# Patient Record
Sex: Female | Born: 1965 | Race: White | Hispanic: No | Marital: Married | Smoking: Never smoker
Health system: Southern US, Community
[De-identification: ages and names within clinical notes are randomized; demographics above are authoritative.]

## PROBLEM LIST (undated history)

## (undated) DIAGNOSIS — B009 Herpesviral infection, unspecified: Secondary | ICD-10-CM

## (undated) DIAGNOSIS — T8859XA Other complications of anesthesia, initial encounter: Secondary | ICD-10-CM

## (undated) DIAGNOSIS — T4145XA Adverse effect of unspecified anesthetic, initial encounter: Secondary | ICD-10-CM

## (undated) DIAGNOSIS — I509 Heart failure, unspecified: Secondary | ICD-10-CM

## (undated) DIAGNOSIS — N9489 Other specified conditions associated with female genital organs and menstrual cycle: Secondary | ICD-10-CM

## (undated) DIAGNOSIS — I219 Acute myocardial infarction, unspecified: Secondary | ICD-10-CM

## (undated) DIAGNOSIS — I2510 Atherosclerotic heart disease of native coronary artery without angina pectoris: Secondary | ICD-10-CM

## (undated) HISTORY — DX: Other specified conditions associated with female genital organs and menstrual cycle: N94.89

## (undated) HISTORY — DX: Herpesviral infection, unspecified: B00.9

---

## 2006-03-29 HISTORY — PX: INGUINAL HERNIA REPAIR: SHX194

## 2007-04-17 ENCOUNTER — Other Ambulatory Visit: Admission: RE | Admit: 2007-04-17 | Discharge: 2007-04-17 | Payer: Self-pay | Admitting: Obstetrics & Gynecology

## 2008-04-25 ENCOUNTER — Other Ambulatory Visit: Admission: RE | Admit: 2008-04-25 | Discharge: 2008-04-25 | Payer: Self-pay | Admitting: Obstetrics and Gynecology

## 2010-07-23 ENCOUNTER — Ambulatory Visit (HOSPITAL_COMMUNITY)
Admission: RE | Admit: 2010-07-23 | Discharge: 2010-07-23 | Disposition: A | Discharge status: Home / Self Care | Payer: BC Managed Care – PPO | Source: Ambulatory Visit | Attending: Obstetrics & Gynecology | Admitting: Obstetrics & Gynecology

## 2010-07-23 ENCOUNTER — Other Ambulatory Visit: Payer: Self-pay | Admitting: Obstetrics & Gynecology

## 2010-07-23 DIAGNOSIS — R1031 Right lower quadrant pain: Secondary | ICD-10-CM | POA: Insufficient documentation

## 2012-06-12 ENCOUNTER — Encounter: Payer: Self-pay | Admitting: Certified Nurse Midwife

## 2012-06-14 ENCOUNTER — Encounter: Payer: Self-pay | Admitting: Certified Nurse Midwife

## 2012-06-14 ENCOUNTER — Ambulatory Visit (INDEPENDENT_AMBULATORY_CARE_PROVIDER_SITE_OTHER): Payer: BC Managed Care – PPO | Admitting: Certified Nurse Midwife

## 2012-06-14 VITALS — BP 110/70

## 2012-06-14 DIAGNOSIS — R5383 Other fatigue: Secondary | ICD-10-CM

## 2012-06-14 DIAGNOSIS — F458 Other somatoform disorders: Secondary | ICD-10-CM

## 2012-06-14 DIAGNOSIS — F419 Anxiety disorder, unspecified: Secondary | ICD-10-CM

## 2012-06-14 NOTE — Progress Notes (Addendum)
47 y.o. mwf female g3p2012 here for discussion of concerns with panic attack and questionable anxiety noted in August, 2013. Discussed concerns about "worry about anything" seems better since aex 05/17/2012. Started on Vitamin D3 50,000 one month ago and also started Magnesium 325 mg po at the same time. "My goal is to decrease stress in my life, so reading and doing self reflection."  Now doing day to day scheduling, which is working.  Denies insomnia, crying, panic attacks or change appetite. Family has noticed "she is less fatigued and stressed"  Sleeping well now especially with Magnesium use.  Exercising daily, and work entails walking distances. Patient feels medication is not necessary at this time.Denies self harm or others  O: Healthy WD/WN appropriate dress, affect normal, orientation x3  AEX WNL  A: Social Anxiety responding to self treatment with Vitamin D3 and Magnesium and Self meditation  P:Continue OTC medication daily, self meditation, and seek support from family Has rescue remedy(OTC) for use if panic attack occurs.  Patient counseled that if her self treatment does not continue to help return visit asap. Lab draw Vitamin D Return visit prn    Face to face regarding social stress and management  Time 38 minutes Reviewed, TL

## 2012-06-14 NOTE — Patient Instructions (Signed)

## 2012-06-19 ENCOUNTER — Other Ambulatory Visit: Payer: Self-pay

## 2012-06-19 DIAGNOSIS — E559 Vitamin D deficiency, unspecified: Secondary | ICD-10-CM

## 2012-07-20 ENCOUNTER — Other Ambulatory Visit (INDEPENDENT_AMBULATORY_CARE_PROVIDER_SITE_OTHER): Payer: BC Managed Care – PPO

## 2012-07-20 DIAGNOSIS — E559 Vitamin D deficiency, unspecified: Secondary | ICD-10-CM

## 2012-07-26 ENCOUNTER — Telehealth: Payer: Self-pay | Admitting: *Deleted

## 2012-07-26 NOTE — Telephone Encounter (Signed)
Pt returning Joy's call regarding Vitamin D level.  Results given and advised to take [phone] IU daily per DL result note.  Pt agreeable with plan and agreeable with recheck at AEX in February 2015.

## 2012-08-30 ENCOUNTER — Ambulatory Visit: Payer: Self-pay | Admitting: Certified Nurse Midwife

## 2012-12-31 ENCOUNTER — Inpatient Hospital Stay (HOSPITAL_COMMUNITY)
Admission: EM | Admit: 2012-12-31 | Discharge: 2013-01-04 | DRG: 550 | Disposition: A | Discharge status: Home / Self Care | Payer: BC Managed Care – PPO | Source: Other Acute Inpatient Hospital | Attending: Interventional Cardiology | Admitting: Interventional Cardiology

## 2012-12-31 ENCOUNTER — Encounter (HOSPITAL_COMMUNITY)
Admission: EM | Disposition: A | Discharge status: Home / Self Care | Payer: BC Managed Care – PPO | Source: Other Acute Inpatient Hospital | Attending: Interventional Cardiology

## 2012-12-31 ENCOUNTER — Other Ambulatory Visit: Payer: Self-pay

## 2012-12-31 DIAGNOSIS — Z79899 Other long term (current) drug therapy: Secondary | ICD-10-CM

## 2012-12-31 DIAGNOSIS — I2510 Atherosclerotic heart disease of native coronary artery without angina pectoris: Secondary | ICD-10-CM

## 2012-12-31 DIAGNOSIS — I5032 Chronic diastolic (congestive) heart failure: Secondary | ICD-10-CM

## 2012-12-31 DIAGNOSIS — I2582 Chronic total occlusion of coronary artery: Secondary | ICD-10-CM | POA: Diagnosis present

## 2012-12-31 DIAGNOSIS — I219 Acute myocardial infarction, unspecified: Principal | ICD-10-CM | POA: Diagnosis present

## 2012-12-31 DIAGNOSIS — I252 Old myocardial infarction: Secondary | ICD-10-CM | POA: Diagnosis present

## 2012-12-31 DIAGNOSIS — I2542 Coronary artery dissection: Secondary | ICD-10-CM | POA: Diagnosis present

## 2012-12-31 DIAGNOSIS — Z8249 Family history of ischemic heart disease and other diseases of the circulatory system: Secondary | ICD-10-CM

## 2012-12-31 DIAGNOSIS — Z7982 Long term (current) use of aspirin: Secondary | ICD-10-CM

## 2012-12-31 DIAGNOSIS — R079 Chest pain, unspecified: Secondary | ICD-10-CM

## 2012-12-31 DIAGNOSIS — I5041 Acute combined systolic (congestive) and diastolic (congestive) heart failure: Secondary | ICD-10-CM | POA: Diagnosis present

## 2012-12-31 DIAGNOSIS — Z955 Presence of coronary angioplasty implant and graft: Secondary | ICD-10-CM

## 2012-12-31 DIAGNOSIS — I2109 ST elevation (STEMI) myocardial infarction involving other coronary artery of anterior wall: Secondary | ICD-10-CM

## 2012-12-31 DIAGNOSIS — E785 Hyperlipidemia, unspecified: Secondary | ICD-10-CM | POA: Diagnosis present

## 2012-12-31 DIAGNOSIS — I5021 Acute systolic (congestive) heart failure: Secondary | ICD-10-CM

## 2012-12-31 DIAGNOSIS — I5031 Acute diastolic (congestive) heart failure: Secondary | ICD-10-CM

## 2012-12-31 HISTORY — DX: Heart failure, unspecified: I50.9

## 2012-12-31 HISTORY — DX: Acute myocardial infarction, unspecified: I21.9

## 2012-12-31 HISTORY — PX: CORONARY STENT INTERVENTION: CATH118234

## 2012-12-31 HISTORY — PX: LEFT HEART CATHETERIZATION WITH CORONARY ANGIOGRAM: SHX5451

## 2012-12-31 HISTORY — DX: Atherosclerotic heart disease of native coronary artery without angina pectoris: I25.10

## 2012-12-31 LAB — MRSA PCR SCREENING: MRSA by PCR: NEGATIVE

## 2012-12-31 SURGERY — LEFT HEART CATHETERIZATION WITH CORONARY ANGIOGRAM
Anesthesia: LOCAL

## 2012-12-31 MED ORDER — ALPRAZOLAM 0.25 MG PO TABS: 0.2500 mg | ORAL_TABLET | Freq: Two times a day (BID) | ORAL | Status: DC | PRN

## 2012-12-31 MED ORDER — ACETAMINOPHEN 325 MG PO TABS: 650.0000 mg | ORAL_TABLET | ORAL | Status: DC | PRN

## 2012-12-31 MED ORDER — FENTANYL CITRATE 0.05 MG/ML IJ SOLN
INTRAMUSCULAR | Status: AC
  Filled 2012-12-31: qty 2

## 2012-12-31 MED ORDER — MORPHINE SULFATE 10 MG/ML IJ SOLN: 2.0000 mg | INTRAMUSCULAR | Status: AC | PRN

## 2012-12-31 MED ORDER — MIDAZOLAM HCL 2 MG/2ML IJ SOLN
INTRAMUSCULAR | Status: AC
  Filled 2012-12-31: qty 2

## 2012-12-31 MED ORDER — NITROGLYCERIN 0.2 MG/ML ON CALL CATH LAB
INTRAVENOUS | Status: AC
  Filled 2012-12-31: qty 1

## 2012-12-31 MED ORDER — SODIUM CHLORIDE 0.9 % IV SOLN
0.2500 mg/kg/h | INTRAVENOUS | Status: AC
  Filled 2012-12-31: qty 250

## 2012-12-31 MED ORDER — SODIUM CHLORIDE 0.9 % IV SOLN
INTRAVENOUS | Status: DC
  Administered 2012-12-31: 22:00:00 via INTRAVENOUS

## 2012-12-31 MED ORDER — TICAGRELOR 90 MG PO TABS
90.0000 mg | ORAL_TABLET | Freq: Two times a day (BID) | ORAL | Status: DC
  Administered 2013-01-01 – 2013-01-04 (×7): 90 mg via ORAL
  Filled 2012-12-31 (×8): qty 1

## 2012-12-31 MED ORDER — NITROGLYCERIN IN D5W 200-5 MCG/ML-% IV SOLN
15.0000 ug/min | INTRAVENOUS | Status: DC
  Administered 2012-12-31: 15 ug/min via INTRAVENOUS

## 2012-12-31 MED ORDER — NITROGLYCERIN 0.4 MG SL SUBL
0.4000 mg | SUBLINGUAL_TABLET | SUBLINGUAL | Status: DC | PRN
  Filled 2012-12-31: qty 25

## 2012-12-31 MED ORDER — METOPROLOL TARTRATE 12.5 MG HALF TABLET
12.5000 mg | ORAL_TABLET | Freq: Two times a day (BID) | ORAL | Status: DC
  Administered 2012-12-31 – 2013-01-04 (×8): 12.5 mg via ORAL
  Filled 2012-12-31 (×9): qty 1

## 2012-12-31 MED ORDER — TICAGRELOR 90 MG PO TABS
ORAL_TABLET | ORAL | Status: AC
  Filled 2012-12-31: qty 2

## 2012-12-31 MED ORDER — LIDOCAINE HCL (PF) 1 % IJ SOLN
INTRAMUSCULAR | Status: AC
  Filled 2012-12-31: qty 30

## 2012-12-31 MED ORDER — OXYCODONE-ACETAMINOPHEN 5-325 MG PO TABS: 1.0000 | ORAL_TABLET | ORAL | Status: DC | PRN

## 2012-12-31 MED ORDER — ATORVASTATIN CALCIUM 80 MG PO TABS
80.0000 mg | ORAL_TABLET | Freq: Every day | ORAL | Status: DC
  Administered 2013-01-01 – 2013-01-03 (×3): 80 mg via ORAL
  Filled 2012-12-31 (×4): qty 1

## 2012-12-31 MED ORDER — HEPARIN SODIUM (PORCINE) 5000 UNIT/ML IJ SOLN
5000.0000 [IU] | Freq: Three times a day (TID) | INTRAMUSCULAR | Status: DC
  Filled 2012-12-31 (×2): qty 1

## 2012-12-31 MED ORDER — ONDANSETRON HCL 4 MG/2ML IJ SOLN
4.0000 mg | Freq: Four times a day (QID) | INTRAMUSCULAR | Status: DC | PRN
  Administered 2012-12-31 – 2013-01-01 (×3): 4 mg via INTRAVENOUS
  Filled 2012-12-31 (×3): qty 2

## 2012-12-31 MED ORDER — BIVALIRUDIN 250 MG IV SOLR
INTRAVENOUS | Status: AC
  Filled 2012-12-31: qty 250

## 2012-12-31 MED ORDER — NITROGLYCERIN IN D5W 200-5 MCG/ML-% IV SOLN
INTRAVENOUS | Status: AC
  Filled 2012-12-31: qty 250

## 2012-12-31 MED ORDER — ASPIRIN EC 81 MG PO TBEC
81.0000 mg | DELAYED_RELEASE_TABLET | Freq: Every day | ORAL | Status: DC
  Administered 2013-01-01 – 2013-01-04 (×4): 81 mg via ORAL
  Filled 2012-12-31 (×4): qty 1

## 2012-12-31 MED ORDER — HEPARIN (PORCINE) IN NACL 2-0.9 UNIT/ML-% IJ SOLN
INTRAMUSCULAR | Status: AC
  Filled 2012-12-31: qty 1000

## 2012-12-31 NOTE — H&P (Signed)
  47 year old female, who suddenly developed, chest pain, and 9 AM today. The discomfort occurred. After she bent over to pick up and not. The discomfort was 10 of 10 in intensity and persisted until around 3 PM. It resolved, but then spontaneously recurred around 07/27/2003 p.m. and has been severe since that time. She continues to have severe discomfort at this time. She went to [facility], where an EKG demonstrated evidence of anterior infarction. Initial CPK-MB and troponins were significantly elevated. The procedure and risks were explained to the patient in detail. Risks, including stroke, death, myocardial infarction, bleeding, kidney injury, limb ischemia, among others. The patient accepted the procedure under emergency circumstances.

## 2012-12-31 NOTE — H&P (Signed)
Karina Richards is an 47 y.o. female.   Chief Complaint: Chest Pain HPI: Ms Karina Richards presented in transfer from [facility] for management of STEMI. She presented after several hours of central substernal pain that she describes as burning. The pain would come and go. It was not relieved by rest and was worse with movement. She has not had these symptoms before. She was given aspirin, heparin and nitroglycerin and transferred her for PCI. The procedure went well. 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 Given her continued symptoms PCi was performed. A DES was placed to the distal LAD. LV-gram found her EF to be around 40% with elevated LVEDP. Since the procedure she has been pain free. Her groin access site shows no sign of complication. She is a non-smoker and has no history of cocaine use. She is not currently on hormone therapy of any kind. She denies any recent strenuous activity.   Past Medical History  Diagnosis Date  . Herpes simplex type 1 infection     Past Surgical History  Procedure Laterality Date  . Inguinal hernia repair Right 2008    Family History  Problem Relation Age of Onset  . Osteoporosis Mother   . Heart disease Mother   . COPD Mother   . Heart disease Father    Social History:  reports that she has never smoked. She does not have any smokeless tobacco history on file. She reports that she does not drink alcohol or use illicit drugs.  Allergies: No Known Allergies  Medications Prior to Admission  Medication Sig Dispense Refill  . Ascorbic Acid (VITAMIN C PO) Take by mouth daily.      . Cholecalciferol (VITAMIN D-3 PO) Take 4,000 Units by mouth. Pt taking two drops in the am & two drops in the pm      . Flaxseed, Linseed, (FLAXSEED OIL PO) Take by mouth.      . Magnesium Oxide -Mg Supplement POWD Take 325 mg by mouth daily.        No results found for this or any previous visit (from the past 48 hour(s)). No results  found.  Review of Systems  Cardiovascular: Positive for chest pain.  All other systems reviewed and are negative.    Blood pressure 123/78, pulse 86, temperature 97.7 F (36.5 C), temperature source Oral, resp. rate 18, weight 122 lb 15.9 oz (55.79 kg), SpO2 100.00%. Physical Exam  Nursing note and vitals reviewed. Constitutional: She is oriented to person, place, and time. She appears well-developed. No distress.  Neck: No JVD present.  Cardiovascular: Normal rate, regular rhythm and normal heart sounds.   No murmur heard. Respiratory: Effort normal and breath sounds normal. She has no rales.  GI: Soft. Bowel sounds are normal. She exhibits no distension.  Musculoskeletal: She exhibits no edema.  Neurological: She is alert and oriented to person, place, and time.  Skin: She is not diaphoretic.      ECG- Anterior-lateral ST elevation. Sinus rhythm. Inferior reciprocal changes  Assessment/Plan  Ms Karina Richards is a 47 year old female who presented with STEMI due to spontaneous coronary artery dissection involving the LAD. She had a drug eluting stent placed that restored distal flow but has reduced EF with elevated LVEDP.   STEMI- secondary to SCAD, revascularized and pain free. She will need dual antiplatelet therapy for her drug eluting stent for 12 months. She is on aspirin and brilinta. Check lipid panel in the morning. Will  start a low dose beta-blocker for heart rate control   Acute systolic heart failure without shock- Secondary to infarction. Will diurese once her blood pressure tolerates. Ideally ACEi would be added as well.   Diet- cardiac  Cardiac rehab  Full Code  Robyne Peers 12/31/2012, 10:32 PM

## 2012-12-31 NOTE — CV Procedure (Signed)
Emergency Cardiac Catheterization with Coronary Angiography, and PCI Report  Yury Schaus  47 y.o.  female Mar 22, 1966  Procedure Date: 12/31/2012  Referring Physician: [facility] ED Primary Cardiologist: Gwynneth Albright, M.D.   PROCEDURE:  Left heart catheterization with selective coronary angiography, left ventriculogram, and LAD PCI with DES  INDICATIONS:  Anteroapical ST elevation MI  The risks, benefits, and details of the procedure were explained to the patient.  The patient verbalized understanding and wanted to proceed.  Informed written consent was obtained.  PROCEDURE TECHNIQUE:  We initially attempted radial access. The radial artery was entered with an anterior wall stick. Attempts to advance the guidewire lead to intense spasm attempts to perform the procedure from the radial approach were aborted.  After Xylocaine anesthesia a 6 French sheath was placed in the right femoral artery with a single anterior needle wall stick.   Coronary angiography was done using an A2 MP 5 French catheter.  Left ventriculography was done using hand injection and the 5 [place] A2 MP catheter.   Analysis of the digital images suggested the presence of "Spontaneous Coronary Dissection (SCAD)". The ischemic syndrome had been ongoing for 6-8 hours at the time of presentation. Because of ongoing symptoms, reperfusion therapy was felt indicated. A bivalirudin bolus and infusion was started. She was loaded with Brilinta 180 mg orally. We then used a 0.014 BMW wire and after a great deal of manipulation were able to cross the total occlusion in the distal third of the LAD and navigate the wire to the left ventricular apex. This led to reperfusion. The distal vessel was noted to be quite tortuous. Pseudo-stenosis was noted distally. We predilated the occluded region with a 2.0 x 12 mm long balloon and overlapping, sequential fashion. This improved. The flow. There is a clear,  relatively focal dissection in the region. That was previously totally occluded. I decided to place a Xience Xpedition 2.5 x 15 mm long DE stent. This stent was deployed at 10 atmospheres x2 inflations. High pressure post dilatation was not performed. This led to dramatic improvement in flow. The very distal vessel was noted to have diffuse narrowing near the apex. TIMI grade 3 flow was noted.   CONTRAST:  Total of 180 cc.  COMPLICATIONS:  None.    HEMODYNAMICS:  Aortic pressure was 158/92 mmHg; LV pressure was 148/28 mmHg; LVEDP 34 mmHg.  There was no gradient between the left ventricle and aorta.    ANGIOGRAPHIC DATA:   The left main coronary artery is patent with mild ostial narrowing..  The left anterior descending artery is is large and transapical. He gives origin to 2 diagonal branches. In the distal third. The vessel is totally occluded with the appearance of probable spontaneous coronary artery dissection, appear. No collaterals are noted..  The left circumflex artery is gives origin to 2 obtuse marginal branches. Severe tortuosity is noted in the marginal branches. No obstructions noted..  The right coronary artery is widely patent and dominant, giving a large PDA and 2 large left ventricular branches.  PCI RESULTS: After balloon angioplasty and stenting of a relatively focal region of dissection in the distal third of the LAD, TIMI grade 3 flow was reestablished. Significant reduction in symptoms occurred.  LEFT VENTRICULOGRAM:  Left ventricular angiogram was done in the 30 RAO projection and revealed apical akinesis/dyskinesis. Ejection fraction of 40%.  LVEDP was 32 mmHg.  IMPRESSIONS:  1. Anteroapical ST elevation MI, likely resulting from  spontaneous coronary artery dissection, involving the mid to distal LAD.  2. Successful DES implantation in the dissection inlet with restoration of TIMI grade 3 flow.  3. Apical akinesis/dyskinesis, with the patient at high risk for  development of apical thrombus.  4. Widely patent circumflex, right coronary, and proximal two thirds of the LAD. The ostium of the left main contains ostial 20% narrowing.   RECOMMENDATION:  Aspirin andBrilinta.  Statin therapy.  Beta blocker therapy, and aggressive blood pressure control.  2-D echocardiogram with contrast prior to discharge to rule out apical thrombus.

## 2012-12-31 NOTE — Progress Notes (Signed)
Chaplain responded to code stemi.  Chaplain sat and visited with husband, sister and brother-in-law of pt while pt was in cath lab.  Chaplain offered spiritual and emotional support.   Family of pt expressed appreciation for chaplain's support.     12/31/12 2100  Clinical Encounter Type  Visited With Family  Visit Type Spiritual support;Code  Referral From Physician  Spiritual Encounters  Spiritual Needs Emotional    Rulon Abide

## 2013-01-01 DIAGNOSIS — I2109 ST elevation (STEMI) myocardial infarction involving other coronary artery of anterior wall: Secondary | ICD-10-CM

## 2013-01-01 LAB — LIPID PANEL
HDL: 59 mg/dL (ref >39)
LDL Cholesterol: 137 mg/dL — ABNORMAL HIGH (ref 0–99)
Triglycerides: 54 mg/dL (ref <150)

## 2013-01-01 LAB — BASIC METABOLIC PANEL
BUN: 11 mg/dL (ref 6–23)
Chloride: 102 mEq/L (ref 96–112)
Creatinine, Ser: 0.67 mg/dL (ref 0.50–1.10)
GFR calc Af Amer: >90 mL/min (ref >90)
GFR calc non Af Amer: >90 mL/min (ref >90)
Potassium: 4 mEq/L (ref 3.5–5.1)
Sodium: 135 mEq/L (ref 135–145)

## 2013-01-01 LAB — CREATININE, SERUM
Creatinine, Ser: 0.64 mg/dL (ref 0.50–1.10)
GFR calc Af Amer: >90 mL/min (ref >90)
GFR calc non Af Amer: >90 mL/min (ref >90)

## 2013-01-01 LAB — CBC
HCT: 35.6 % — ABNORMAL LOW (ref 36.0–46.0)
HCT: 36.3 % (ref 36.0–46.0)
Hemoglobin: 12.6 g/dL (ref 12.0–15.0)
Hemoglobin: 12.8 g/dL (ref 12.0–15.0)
MCH: 29.9 pg (ref 26.0–34.0)
MCHC: 35.4 g/dL (ref 30.0–36.0)
MCHC: 35.3 g/dL (ref 30.0–36.0)
Platelets: 182 10*3/uL (ref 150–400)
RBC: 4.21 MIL/uL (ref 3.87–5.11)
RBC: 4.26 MIL/uL (ref 3.87–5.11)
RDW: 13.1 % (ref 11.5–15.5)
WBC: 10.5 10*3/uL (ref 4.0–10.5)

## 2013-01-01 NOTE — Progress Notes (Signed)
Per RN pt has been very nauseated. Will hold ambulation for now. Ethelda Chick CES, ACSM 2:24 PM 01/01/2013

## 2013-01-01 NOTE — Progress Notes (Addendum)
Patient Name: Karina Richards Date of Encounter: 01/01/2013    SUBJECTIVE:  The patient feels tight in her chest. She denies dyspnea. There is no chest pain. She had nausea earlier this morning.  TELEMETRY:  Normal sinus rhythm: Filed Vitals:   01/01/13 0800 01/01/13 0900 01/01/13 0940 01/01/13 1000  BP: 113/71 113/85 113/85 124/86  Pulse: 66 63 77 69  Temp:      TempSrc:      Resp: 13 14  15   Weight:      SpO2: 99% 98%  99%    Intake/Output Summary (Last 24 hours) at 01/01/13 1116 Last data filed at 01/01/13 1100  Gross per 24 hour  Intake 1759.41 ml  Output    650 ml  Net 1109.41 ml    LABS: Basic Metabolic Panel:  Recent Labs  16/10/96 2108 01/01/13 0530  NA  --  135  K  --  4.0  CL  --  102  CO2  --  23  GLUCOSE  --  135*  BUN  --  11  CREATININE 0.64 0.67  CALCIUM  --  8.4   CBC:  Recent Labs  12/31/12 2108 01/01/13 0530  WBC 11.7* 10.5  HGB 12.6 12.8  HCT 35.6* 36.3  MCV 84.6 85.2  PLT 182 189    Fasting Lipid Panel:  Recent Labs  01/01/13 0530  CHOL 207*  HDL 59  LDLCALC 137*  TRIG 54  CHOLHDL 3.5    Radiology/Studies:  No new data  Physical Exam: Blood pressure 124/86, pulse 69, temperature 98 F (36.7 C), temperature source Oral, resp. rate 15, weight 122 lb 15.9 oz (55.79 kg), SpO2 99.00%. Weight change:    S4 gallop on auscultation Clear. Lungs Right femoral cath site unremarkable  ASSESSMENT:  1. Acute anteroapical MI secondary to "SCAD" of the mid to distal LAD.  2. Apical dyskinesis with increased risk of apical thrombus.  3. Systolic heart failure, acute  Plan:  1. Acute anteroapical MI with improving symptoms, status post stenting. Plan Brilinta and aspirin therapy.  2. May need diuresis.  3. Will need a contrast echo prior to discharge to rule out apical thrombus.  [name] 01/01/2013, 11:16 AM

## 2013-01-01 NOTE — Progress Notes (Signed)
Patient's Troponin level was 12.07 at 1642. Dr. Katrinka Blazing is aware that Troponin level will be elevated, patient's VS stable no chest pain and no EKG changes noted. Nurse will continue to monitor next Troponin lab results and report findings to MD.

## 2013-01-02 ENCOUNTER — Inpatient Hospital Stay (HOSPITAL_COMMUNITY): Payer: BC Managed Care – PPO

## 2013-01-02 ENCOUNTER — Encounter (HOSPITAL_COMMUNITY): Payer: Self-pay | Admitting: *Deleted

## 2013-01-02 DIAGNOSIS — I5021 Acute systolic (congestive) heart failure: Secondary | ICD-10-CM

## 2013-01-02 LAB — BASIC METABOLIC PANEL
BUN: 8 mg/dL (ref 6–23)
CO2: 21 mEq/L (ref 19–32)
Chloride: 103 mEq/L (ref 96–112)
GFR calc Af Amer: >90 mL/min (ref >90)
Glucose, Bld: 116 mg/dL — ABNORMAL HIGH (ref 70–99)
Potassium: 3.8 mEq/L (ref 3.5–5.1)

## 2013-01-02 LAB — TROPONIN I: Troponin I: 7.18 ng/mL (ref <0.30)

## 2013-01-02 MED FILL — Sodium Chloride IV Soln 0.9%: INTRAVENOUS | Qty: 50 | Status: AC

## 2013-01-02 NOTE — Care Management Note (Addendum)
    Page 1 of 1   01/04/2013     1:09:05 PM   CARE MANAGEMENT NOTE 01/04/2013  Patient:  Karina Richards, Karina Richards   Account Number:  [id number]  Date Initiated:  01/01/2013  Documentation initiated by:  Junius Creamer  Subjective/Objective Assessment:   adm w mi     Action/Plan:   lives w husband, pcp dr Renae Fickle sasser   Anticipated DC Date:  01/03/2013   Anticipated DC Plan:  HOME/SELF CARE      DC Planning Services  CM consult  Medication Assistance      Choice offered to / List presented to:             Status of service:  In process, will continue to follow Medicare Important Message given?   (If response is "NO", the following Medicare IM given date fields will be blank) Date Medicare IM given:   Date Additional Medicare IM given:    Discharge Disposition:  HOME/SELF CARE  Per UR Regulation:  Reviewed for med. necessity/level of care/duration of stay  If discussed at Long Length of Stay Meetings, dates discussed:    Comments:  01-04-13 12:30pm [name] per rep at [facility]: covered, $5 co-pay no auth required for 1st 2 fills, after that it would be 50% of total cost for 90 day supply at [facility] pharmacy only co-pay would be $0 Called Dr. Katrinka Blazing - will replace prescription order for 90 day supply.  Nurse and patient updated.  01-02-13 3:42pm Karina Richards, RNBSN  [phone]  [phone] Insurance check on brilinta shows insurance expired in 6-14. Talked with patient and states she recieved a new card.  Not sure if registration got on admission.  Informed they will need to see to place in system.  States her husband can bring by [employer].  Will run insurance check then.  10/6 0949 debbie dowell rn,bsn pt states has bcbs ins. gave pt brilinta 30days free and copay assist card.

## 2013-01-02 NOTE — Progress Notes (Signed)
Got pt up to [facility]. Pt complained of "cramping" in R groin area. On assessment, pt R groin with palpable hematoma. VSS. Pt states that cramping is better after laying back down in bed. Will keep pt on bedrest until MD can evaluate. Will continue to monitor closely.

## 2013-01-02 NOTE — Progress Notes (Signed)
CARDIAC REHAB PHASE I   PRE:  Rate/Rhythm: 79SR  BP:  Supine:   Sitting: 94/70  Standing:    SaO2:   MODE:  Ambulation: 475 ft   POST:  Rate/Rhythm: 75  BP:  Supine:   Sitting: 88/68  Standing:    SaO2: 98%RA 1325-1420 Pt walked 475 ft on RA with hand held asst. Denied CP or dizziness. MI ed began. Many questions re activity and MI restrictions. Pt has been under a lot of stress with mother passing and new job. Discussed having family help more around the house and pt finding time to relax. Left heart healthy diet for pt to read. Will follow up tomorrow to complete ed. Pt states does not like to take medications. Stressed to pt that she has to take aspirin and brilinta. Has brililnta packet. Asked case management to find out for pt cost of brilinta at her pharmacy.    Luetta Nutting, RN BSN  01/02/2013 2:14 PM

## 2013-01-02 NOTE — Progress Notes (Signed)
Patient Name: Karina Richards Date of Encounter: 01/02/2013    SUBJECTIVE: She is doing much better. No chest discomfort, or dyspnea. Multiple questions. Slept well. Some right groin discomfort.  TELEMETRY:  Normal sinus rhythm: Filed Vitals:   01/02/13 0645 01/02/13 0700 01/02/13 0730 01/02/13 0800  BP: 113/69 109/74  117/87  Pulse: 73 74  85  Temp:   99.7 F (37.6 C)   TempSrc:   Oral   Resp: 18 13  25   Height:    5\' 6"  (1.676 m)  Weight:    122 lb 2.2 oz (55.4 kg)  SpO2: 99% 99%  100%    Intake/Output Summary (Last 24 hours) at 01/02/13 0908 Last data filed at 01/02/13 0800  Gross per 24 hour  Intake    318 ml  Output   1625 ml  Net  -1307 ml    LABS: Basic Metabolic Panel:  Recent Labs  65/78/46 0530 01/02/13 0450  NA 135 136  K 4.0 3.8  CL 102 103  CO2 23 21  GLUCOSE 135* 116*  BUN 11 8  CREATININE 0.67 0.69  CALCIUM 8.4 8.3*   CBC:  Recent Labs  12/31/12 2108 01/01/13 0530  WBC 11.7* 10.5  HGB 12.6 12.8  HCT 35.6* 36.3  MCV 84.6 85.2  PLT 182 189   Cardiac Enzymes:  Recent Labs  01/01/13 1642 01/01/13 2218 01/02/13 0450  TROPONINI 12.07* 11.62* 7.18*    Fasting Lipid Panel:  Recent Labs  01/01/13 0530  CHOL 207*  HDL 59  LDLCALC 137*  TRIG 54  CHOLHDL 3.5    Radiology/Studies:  No new data  Physical Exam: Blood pressure 117/87, pulse 85, temperature 99.7 F (37.6 C), temperature source Oral, resp. rate 25, height 5\' 6"  (1.676 m), weight 122 lb 2.2 oz (55.4 kg), SpO2 100.00%. Weight change:    Chest is clear.  S4 gallop. No murmur or rub.  Groin without hematoma or ecchymosis.  ASSESSMENT:  1. Anteroapical MI, do to spontaneous coronary artery dissection. No ischemic symptoms or heart failure symptoms. 2. Significant elevation in lipids 3. Acute diastolic heart failure, resolved.   Plan:  1. Ambulate.  2. Homan 48 hours.  3. Contrast of echo prior to discharge to rule out apical thrombus.  4. Phase I and II  cardiac rehabilitation.  5. Transfer to telemetry  Signed, Lesleigh Noe 01/02/2013, 9:08 AM

## 2013-01-03 DIAGNOSIS — I519 Heart disease, unspecified: Secondary | ICD-10-CM

## 2013-01-03 MED ORDER — PERFLUTREN LIPID MICROSPHERE
1.0000 mL | INTRAVENOUS | Status: AC | PRN
  Administered 2013-01-03: 1 mL via INTRAVENOUS
  Filled 2013-01-03: qty 10

## 2013-01-03 MED ORDER — PERFLUTREN LIPID MICROSPHERE
INTRAVENOUS | Status: AC
  Filled 2013-01-03: qty 10

## 2013-01-03 MED ORDER — ASPIRIN 81 MG PO CHEW
CHEWABLE_TABLET | ORAL | Status: AC
  Filled 2013-01-03: qty 1

## 2013-01-03 MED ORDER — LISINOPRIL 2.5 MG PO TABS
2.5000 mg | ORAL_TABLET | Freq: Every day | ORAL | Status: DC
  Administered 2013-01-03 – 2013-01-04 (×2): 2.5 mg via ORAL
  Filled 2013-01-03 (×2): qty 1

## 2013-01-03 NOTE — Progress Notes (Signed)
Patient Name: Karina Richards Date of Encounter: 01/03/2013    SUBJECTIVE: Feels well. She is very frightened. No chest pain or dyspnea. No medication side effects  TELEMETRY:  Normal sinus rhythm: Filed Vitals:   01/02/13 1215 01/02/13 1400 01/02/13 2034 01/03/13 0450  BP: 112/74 110/75 103/67 100/61  Pulse: 72 75 83 73  Temp: 97.9 F (36.6 C) 98 F (36.7 C) 98.2 F (36.8 C) 98.9 F (37.2 C)  TempSrc: Oral Oral Oral Oral  Resp: 18 18 18 20   Height:      Weight:      SpO2: 96% 95% 97% 98%    Intake/Output Summary (Last 24 hours) at 01/03/13 0905 Last data filed at 01/02/13 2126  Gross per 24 hour  Intake    600 ml  Output   1300 ml  Net   -700 ml    LABS: Basic Metabolic Panel:  Recent Labs  40/98/11 0530 01/02/13 0450  NA 135 136  K 4.0 3.8  CL 102 103  CO2 23 21  GLUCOSE 135* 116*  BUN 11 8  CREATININE 0.67 0.69  CALCIUM 8.4 8.3*   CBC:  Recent Labs  12/31/12 2108 01/01/13 0530  WBC 11.7* 10.5  HGB 12.6 12.8  HCT 35.6* 36.3  MCV 84.6 85.2  PLT 182 189   Cardiac Enzymes:  Recent Labs  01/01/13 1642 01/01/13 2218 01/02/13 0450  TROPONINI 12.07* 11.62* 7.18*   BNP: No components found with this basename: POCBNP,  Hemoglobin A1C: No results found for this basename: HGBA1C,  in the last 72 hours Fasting Lipid Panel:  Recent Labs  01/01/13 0530  CHOL 207*  HDL 59  LDLCALC 137*  TRIG 54  CHOLHDL 3.5    Radiology/Studies:  Small bilateral pleural effusions on 01/02/13  Physical Exam: Blood pressure 100/61, pulse 73, temperature 98.9 F (37.2 C), temperature source Oral, resp. rate 20, height 5\' 6"  (1.676 m), weight 122 lb 2.2 oz (55.4 kg), SpO2 98.00%. Weight change:    No rub, or gallop is heard. Neck veins are flat Lungs are clear clear  ASSESSMENT:  1. Apical infarction, do to LAD occlusion from SACD.  2. Bilateral pleural effusions, likely secondary to acute diastolic heart failure from myocardial infarction.  3.  Hyperlipidemia   Plan:  1. Contrast echo to rule out apical thrombus.  2. Anticipate discharge in a.m.  3. Start low-dose ACE  Signed, Lesleigh Noe 01/03/2013, 9:05 AM

## 2013-01-03 NOTE — Progress Notes (Signed)
1400-1440 Observed  Pt up in hall walking independently without dizziness or CP. Tolerated well. Walked over 1000 ft. Pt wanted husband here for rest of education, so I waited until he was here. Completed MI ed on exercise and briefly reviewed MI restrictions and stent/brililnta with husband. Discussed CRP 2 and will refer to [place] since pt works here. Luetta Nutting RNBSN

## 2013-01-03 NOTE — Plan of Care (Signed)
Problem: Phase II Progression Outcomes Goal: Hemodynamically stable Outcome: Progressing Patient has remained NSR this shift.  Vitals stable.  No complaint of chest pain.  Progressing with ambulation.

## 2013-01-04 DIAGNOSIS — I5032 Chronic diastolic (congestive) heart failure: Secondary | ICD-10-CM

## 2013-01-04 DIAGNOSIS — I5031 Acute diastolic (congestive) heart failure: Secondary | ICD-10-CM

## 2013-01-04 DIAGNOSIS — E785 Hyperlipidemia, unspecified: Secondary | ICD-10-CM | POA: Diagnosis present

## 2013-01-04 MED ORDER — ASPIRIN 81 MG PO TBEC: 81.0000 mg | DELAYED_RELEASE_TABLET | Freq: Every day | ORAL | Status: AC

## 2013-01-04 MED ORDER — NITROGLYCERIN 0.4 MG SL SUBL: 0.4000 mg | SUBLINGUAL_TABLET | SUBLINGUAL | Status: DC | PRN

## 2013-01-04 MED ORDER — TICAGRELOR 90 MG PO TABS: 90.0000 mg | ORAL_TABLET | Freq: Two times a day (BID) | ORAL | Status: DC

## 2013-01-04 MED ORDER — METOPROLOL TARTRATE 12.5 MG HALF TABLET: 12.5000 mg | ORAL_TABLET | Freq: Two times a day (BID) | ORAL | Status: DC

## 2013-01-04 MED ORDER — LISINOPRIL 2.5 MG PO TABS: 2.5000 mg | ORAL_TABLET | Freq: Every day | ORAL | Status: DC

## 2013-01-04 MED ORDER — ATORVASTATIN CALCIUM 40 MG PO TABS: 40.0000 mg | ORAL_TABLET | Freq: Every day | ORAL | Status: DC

## 2013-01-04 NOTE — Discharge Summary (Signed)
Patient ID: Karina Richards MRN: [id number] DOB/AGE: 1965/05/08 47 y.o.  Admit date: 12/31/2012 Discharge date: 01/04/2013 Patient Active Problem List   Diagnosis Date Noted  . Acute diastolic heart failure 01/04/2013    Priority: High    Class: Acute  . ST elevation myocardial infarction (STEMI) of anterior wall 12/31/2012    Priority: High    Class: Acute  . Other and unspecified hyperlipidemia 01/04/2013    Priority: Medium    Class: Chronic   Primary Discharge Diagnosis: Acute anteroapical myocardial infarction Secondary Discharge Diagnosis: Acute diastolic heart failure Hyperlipidemia  Significant Diagnostic Studies: Coronary angiography and PCI  Consults: Cardiac rehabilitation, Phase I and 2  Hospital Course: Karina Richards is a 47 years of age and presented with prolonged chest pain and dyspnea. EKG demonstrated anteroapical ST elevation. She was taken directly to the catheterization laboratory, where she was found to have total occlusion of the distal half of the LAD. 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 Angioplasty and drug-eluting stent implantation was performed. There was significant improvement in symptoms.  Post procedure, no acute complications occurred. She did develop dyspnea, and BNP was greater than 3000. Findings were most compatible with acute diastolic heart failure. An echocardiogram prior to discharge. Did not demonstrate evidence of an apical thrombus. EF is estimated to be 45%. There was apical akinesis . She had spontaneous diuresis without need for chronic diuretic therapy. Low-dose ACE inhibitor blocker therapy was instituted. LDL. On presentation was 140. She will be discharged on 40 mg of Lipitor.  Arrangements were made for clinical followup in 2 weeks and phase II cardiac rehabilitation.  Discharge Exam: Blood pressure 92/53, pulse 61, temperature 98.1 F (36.7 C), temperature source Oral, resp. rate 18,  height 5\' 6"  (1.676 m), weight 122 lb 2.2 oz (55.4 kg), SpO2 100.00%.    Chest is clear. S4 gallop is audible. No murmur, rub. Abdomen soft. Neck veins nondistended. Right radial cath and right femoral cath site are unremarkable.  Labs:   Lab Results  Component Value Date   WBC 10.5 01/01/2013   HGB 12.8 01/01/2013   HCT 36.3 01/01/2013   MCV 85.2 01/01/2013   PLT 189 01/01/2013    Recent Labs Lab 01/02/13 0450  NA 136  K 3.8  CL 103  CO2 21  BUN 8  CREATININE 0.69  CALCIUM 8.3*  GLUCOSE 116*   Lab Results  Component Value Date   TROPONINI 7.18* 01/02/2013    Lab Results  Component Value Date   CHOL 207* 01/01/2013   Lab Results  Component Value Date   HDL 59 01/01/2013   Lab Results  Component Value Date   LDLCALC 137* 01/01/2013   Lab Results  Component Value Date   TRIG 54 01/01/2013   Lab Results  Component Value Date   CHOLHDL 3.5 01/01/2013   No results found for this basename: LDLDIRECT      Radiology: No acute cardiopulmonary abnormality EKG: Anteroapical/septal Q waves.  FOLLOW UP PLANS AND APPOINTMENTS Discharge Orders   Future Appointments Provider Department Dept Phone   05/21/2013 10:45 AM T[place], [place] [facility] [phone]   Future Orders Complete By Expires   Amb Referral to Cardiac Rehabilitation  As directed    Amb Referral to Cardiac Rehabilitation  As directed    Comments:     S/p anterior MI with LAD stent Please enroll in the Phase 2 program       Medication  List    STOP taking these medications       Magnesium Oxide Powd     Vitamin C Powd      TAKE these medications       aspirin 81 MG EC tablet  Take 1 tablet (81 mg total) by mouth daily.     atorvastatin 40 MG tablet  Commonly known as:  LIPITOR  Take 1 tablet (40 mg total) by mouth daily at 6 PM.     Cholecalciferol Liqd  Take 4,000 Units by mouth daily. Uses one drop on the tongue each morning     lisinopril 2.5 MG tablet    Commonly known as:  PRINIVIL,ZESTRIL  Take 1 tablet (2.5 mg total) by mouth daily.     metoprolol tartrate 12.5 mg Tabs tablet  Commonly known as:  LOPRESSOR  Take 0.5 tablets (12.5 mg total) by mouth 2 (two) times daily.     nitroGLYCERIN 0.4 MG SL tablet  Commonly known as:  NITROSTAT  Place 1 tablet (0.4 mg total) under the tongue every 5 (five) minutes x 3 doses as needed for chest pain.     Ticagrelor 90 MG Tabs tablet  Commonly known as:  BRILINTA  Take 1 tablet (90 mg total) by mouth 2 (two) times daily.           Follow-up Information   Follow up with Lesleigh Noe, MD In 2 weeks.   Specialty:  Cardiology   Contact information:   [address] [place] [address] [phone]       BRING ALL MEDICATIONS WITH YOU TO FOLLOW UP APPOINTMENTS  Time spent with patient to include physician time: 30 minutes Signed: Lesleigh Noe 01/04/2013, 8:53 AM

## 2013-01-04 NOTE — Progress Notes (Signed)
Discharged to home with family office visits in place teaching done  

## 2013-01-10 ENCOUNTER — Telehealth: Payer: Self-pay

## 2013-01-10 NOTE — Telephone Encounter (Signed)
Cardiac rehab form faxed

## 2013-01-18 ENCOUNTER — Telehealth: Payer: Self-pay | Admitting: Interventional Cardiology

## 2013-01-18 NOTE — Telephone Encounter (Signed)
Discussed with dr Katrinka Blazing, pt will come to the office to see dr Katrinka Blazing tomorrow morning @ 11:30 am. Explained to pt dr Katrinka Blazing felt maybe related to the brilinta. The pt will go to the ER tonight if the symptoms change or she develops chest pain. Patient voiced understanding

## 2013-01-18 NOTE — Telephone Encounter (Signed)
New message    Had stents put in 10-5----today feeling chest pressure and pain when she takes a deep breath

## 2013-01-18 NOTE — Telephone Encounter (Signed)
Spoke with pt, she reports today she has noticed that she feels like she needs to take a deep breath and when she does she is getting a pressure in her chest. She is not having chest pain. Will forward for dr Katrinka Blazing review.

## 2013-01-19 ENCOUNTER — Telehealth: Payer: Self-pay

## 2013-01-19 ENCOUNTER — Encounter: Payer: Self-pay | Admitting: Interventional Cardiology

## 2013-01-19 ENCOUNTER — Ambulatory Visit (INDEPENDENT_AMBULATORY_CARE_PROVIDER_SITE_OTHER): Payer: BC Managed Care – PPO | Admitting: Interventional Cardiology

## 2013-01-19 VITALS — BP 90/62 | HR 57 | Ht 66.0 in | Wt 125.4 lb

## 2013-01-19 DIAGNOSIS — E785 Hyperlipidemia, unspecified: Secondary | ICD-10-CM

## 2013-01-19 DIAGNOSIS — R079 Chest pain, unspecified: Secondary | ICD-10-CM

## 2013-01-19 DIAGNOSIS — R0609 Other forms of dyspnea: Secondary | ICD-10-CM

## 2013-01-19 DIAGNOSIS — I5031 Acute diastolic (congestive) heart failure: Secondary | ICD-10-CM

## 2013-01-19 DIAGNOSIS — I2109 ST elevation (STEMI) myocardial infarction involving other coronary artery of anterior wall: Secondary | ICD-10-CM

## 2013-01-19 DIAGNOSIS — R0600 Dyspnea, unspecified: Secondary | ICD-10-CM

## 2013-01-19 MED ORDER — PRASUGREL HCL 10 MG PO TABS: 10.0000 mg | ORAL_TABLET | Freq: Every day | ORAL | Status: DC

## 2013-01-19 NOTE — Progress Notes (Signed)
Patient ID: Karina Richards, female   DOB: July 24, 1965, 47 y.o.   MRN: [id number]    [address] [place], [place]  [address] Phone: [phone] Fax:  [phone]  Date:  01/19/2013   ID:  Karina Richards, DOB 09-12-1965, MRN [id number]  PCP:  Estanislado Pandy, MD   ASSESSMENT:  1. Dyspnea and vague chest discomfort. I suspect the vague discomfort and dyspnea is related to Brilinta 2. Hyperlipidemia 2. 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 Left ventricular dysfunction, apical.  PLAN:  1. Discontinue Brilinta and switch to Effient 10 mg daily.  2. Stat troponin I  3. Keep clinical appointment scheduled for next Wednesday.   SUBJECTIVE: Karina Richards is a 47 y.o. female who is about 2 weeks out from anteroapical infarction related to SCAD. She did receive a drug-eluting stent in the mid LAD. She has had a great recovery and was asymptomatic until Thursday when she awakened feeling the urge to take a deep breath. She had not experienced this sensation before. There is also a sense of mild tightness in the chest. Activity did not alter. Yesterday was particularly bothersome however when she awakened this morning there was no discomfort. She still has occasional urge to take a deep breath. When she takes a deep breath she feels better. She doesn't feel short of breath when she walks. She denies palpitations and syncope. This is different than the sensation she had with her myocardial infarction.   Wt Readings from Last 3 Encounters:  01/19/13 125 lb 6.4 oz (56.881 kg)  01/02/13 122 lb 2.2 oz (55.4 kg)  01/02/13 122 lb 2.2 oz (55.4 kg)     Past Medical History  Diagnosis Date  . Herpes simplex type 1 infection   . Coronary artery disease   . Myocardial infarction   . CHF (congestive heart failure)     Current Outpatient Prescriptions  Medication Sig Dispense Refill  . aspirin EC 81 MG EC tablet Take 1 tablet (81 mg total) by mouth daily.      Marland Kitchen atorvastatin (LIPITOR) 40  MG tablet Take 1 tablet (40 mg total) by mouth daily at 6 PM.  30 tablet  11  . Cholecalciferol LIQD Take 4,000 Units by mouth daily. Uses one drop on the tongue each morning      . lisinopril (PRINIVIL,ZESTRIL) 2.5 MG tablet Take 1 tablet (2.5 mg total) by mouth daily.  30 tablet  11  . metoprolol tartrate (LOPRESSOR) 12.5 mg TABS tablet Take 0.5 tablets (12.5 mg total) by mouth 2 (two) times daily.  60 tablet  11  . nitroGLYCERIN (NITROSTAT) 0.4 MG SL tablet Place 1 tablet (0.4 mg total) under the tongue every 5 (five) minutes x 3 doses as needed for chest pain.  25 tablet  12  . Ticagrelor (BRILINTA) 90 MG TABS tablet Take 1 tablet (90 mg total) by mouth 2 (two) times daily.  180 tablet  3   No current facility-administered medications for this visit.    Allergies:   No Known Allergies  Social History:  The patient  reports that she has never smoked. She does not have any smokeless tobacco history on file. She reports that she does not drink alcohol or use illicit drugs.   ROS:  Please see the history of present illness.   Bruising and soreness in the right groin and thigh region getting better daily. No chills, fever, rash, abdominal pain, blood in urine or stool.   All other systems  reviewed and negative.   OBJECTIVE: VS:  BP 90/62  Pulse 57  Ht 5\' 6"  (1.676 m)  Wt 125 lb 6.4 oz (56.881 kg)  BMI 20.25 kg/m2 Well nourished, well developed, in no acute distress, slender and anxious HEENT: normal Neck: JVD none. Carotid bruit none  Cardiac:  normal S1, S2; RRR; no murmur Lungs:  clear to auscultation bilaterally, no wheezing, rhonchi or rales Abd: soft, nontender, no hepatomegaly Ext: Edema none. Pulses 2+. Firm nodule noted over the right femoral which is the site of cath. No bruit is heard. Skin: warm and dry Neuro:  CNs 2-12 intact, no focal abnormalities noted  EKG:  Sinus bradycardia/normal sinus rhythm with right bundle branch block and old septal Q waves and marked  repolarization abnormality with deep inverted T waves throughout the precordium       Signed, Darci Needle III, MD 01/19/2013 12:06 PM

## 2013-01-19 NOTE — Telephone Encounter (Signed)
lmom pt stat lab negative

## 2013-01-19 NOTE — Patient Instructions (Addendum)
Stop Brilinta  Start Effient 10mg  Daily  Labs today: Troponin  Keep your follow up appointment with Dr.Smith for 01/24/13 @3 :30pm

## 2013-01-23 ENCOUNTER — Encounter: Payer: Self-pay | Admitting: Interventional Cardiology

## 2013-01-24 ENCOUNTER — Ambulatory Visit (INDEPENDENT_AMBULATORY_CARE_PROVIDER_SITE_OTHER): Payer: BC Managed Care – PPO | Admitting: Interventional Cardiology

## 2013-01-24 ENCOUNTER — Encounter: Payer: Self-pay | Admitting: Interventional Cardiology

## 2013-01-24 VITALS — BP 123/79 | HR 52 | Ht 66.0 in | Wt 124.0 lb

## 2013-01-24 DIAGNOSIS — R0609 Other forms of dyspnea: Secondary | ICD-10-CM

## 2013-01-24 DIAGNOSIS — E785 Hyperlipidemia, unspecified: Secondary | ICD-10-CM

## 2013-01-24 DIAGNOSIS — I2109 ST elevation (STEMI) myocardial infarction involving other coronary artery of anterior wall: Secondary | ICD-10-CM

## 2013-01-24 DIAGNOSIS — R0600 Dyspnea, unspecified: Secondary | ICD-10-CM

## 2013-01-24 DIAGNOSIS — I5031 Acute diastolic (congestive) heart failure: Secondary | ICD-10-CM

## 2013-01-24 NOTE — Patient Instructions (Signed)
Your physician recommends that you continue on your current medications as directed. Please refer to the Current Medication list given to you today.  Your physician recommends that you schedule a follow-up appointment in: 03/19/13 @3 :45pm

## 2013-01-24 NOTE — Progress Notes (Signed)
Patient ID: Karina Richards, female   DOB: Aug 12, 1965, 47 y.o.   MRN: [id number]    [address] [place], [place]  [address] Phone: [phone] Fax:  [phone]  Date:  01/24/2013   ID:  Karina Richards, DOB Aug 01, 1965, MRN [id number]  PCP:  Estanislado Pandy, MD   ASSESSMENT:  1. Recent anteroapical myocardial infarction treated with drug-eluting stent in the mid to distal LAD. 2. Dyspnea as a side effect of Brilinta has resolved with discontinuation and starting Effient. 3. Hyperlipidemia on therapy 4. Relative hypotension based upon the patient's home recordings  PLAN:  1. Continue the current medical regimen with the exception of decreasing metoprolol to 12.5 mg each evening 2. May return to work on 02/06/2013 without restrictions. 3. Phase II cardiac rehabilitation recommended 4. Clinical followup 6-8 weeks   SUBJECTIVE: Karina Richards is a 47 y.o. female is doing well after being seen approximately 5 days ago with a sense of dyspnea. I suspected that this was related to Brilinta. We switched her to Effient, and this feeling has completely resolved. She is back to a fairly normal lifestyle. She denies orthopnea, palpitations, and syncope. She has many questions that were answered concerning her activity and when she can return to work. A long discussion about the value of cardiac rehabilitation. I did caution against walking alone especially in remote areas until we have further along in the recuperation from the myocardial infarction.   Wt Readings from Last 3 Encounters:  01/24/13 124 lb (56.246 kg)  01/19/13 125 lb 6.4 oz (56.881 kg)  01/02/13 122 lb 2.2 oz (55.4 kg)     Past Medical History  Diagnosis Date  . Herpes simplex type 1 infection   . Coronary artery disease   . Myocardial infarction   . CHF (congestive heart failure)     Current Outpatient Prescriptions  Medication Sig Dispense Refill  . aspirin EC 81 MG EC tablet Take 1 tablet (81 mg total)  by mouth daily.      Marland Kitchen atorvastatin (LIPITOR) 40 MG tablet Take 1 tablet (40 mg total) by mouth daily at 6 PM.  30 tablet  11  . Cholecalciferol LIQD Take 4,000 Units by mouth daily. Uses one drop on the tongue each morning      . lisinopril (PRINIVIL,ZESTRIL) 2.5 MG tablet Take 1 tablet (2.5 mg total) by mouth daily.  30 tablet  11  . metoprolol tartrate (LOPRESSOR) 12.5 mg TABS tablet Take 0.5 tablets (12.5 mg total) by mouth 2 (two) times daily.  60 tablet  11  . nitroGLYCERIN (NITROSTAT) 0.4 MG SL tablet Place 1 tablet (0.4 mg total) under the tongue every 5 (five) minutes x 3 doses as needed for chest pain.  25 tablet  12  . prasugrel (EFFIENT) 10 MG TABS tablet Take 1 tablet (10 mg total) by mouth daily.       No current facility-administered medications for this visit.    Allergies:   No Known Allergies  Social History:  The patient  reports that she has never smoked. She does not have any smokeless tobacco history on file. She reports that she does not drink alcohol or use illicit drugs.   ROS:  Please see the history of present illness.      All other systems reviewed and negative.   OBJECTIVE: VS:  BP 123/79  Pulse 52  Ht 5\' 6"  (1.676 m)  Wt 124 lb (56.246 kg)  BMI 20.02 kg/m2 Well nourished, well developed,  in no acute distress HEENT: normal Neck: JVD flat. Carotid bruit  absent  Cardiac:  normal S1, S2; RRR; no murmur Lungs:  clear to auscultation bilaterally, no wheezing, rhonchi or rales Abd: soft, nontender, no hepatomegaly Ext: Edema absent. Pulses 2+ and symmetric Skin: warm and dry Neuro:  CNs 2-12 intact, no focal abnormalities noted  EKG:  Not repeated       Signed, Darci Needle III, MD 01/24/2013 4:45 PM

## 2013-02-01 ENCOUNTER — Other Ambulatory Visit: Payer: Self-pay | Admitting: *Deleted

## 2013-02-01 MED ORDER — PRASUGREL HCL 10 MG PO TABS: 10.0000 mg | ORAL_TABLET | Freq: Every day | ORAL | Status: DC

## 2013-02-05 ENCOUNTER — Telehealth (HOSPITAL_COMMUNITY): Payer: Self-pay | Admitting: Cardiac Rehabilitation

## 2013-02-05 NOTE — Telephone Encounter (Signed)
pc received from pt stating she is not interested in participating in cardiac rehab.  She is exercising on her own.  Dr. Katrinka Blazing made aware.

## 2013-02-26 ENCOUNTER — Telehealth: Payer: Self-pay

## 2013-02-26 NOTE — Telephone Encounter (Signed)
Pt dropped of disability forms. Will give to medical records for completion

## 2013-02-26 NOTE — Telephone Encounter (Signed)
lmom pt disability forms will be sent to [employer] for completion

## 2013-03-12 ENCOUNTER — Telehealth: Payer: Self-pay | Admitting: Certified Nurse Midwife

## 2013-03-12 NOTE — Telephone Encounter (Signed)
Needs appointment

## 2013-03-12 NOTE — Telephone Encounter (Signed)
Karina Richards, patient has not had menses. Scheduled for AEX 04/2013. Do you need to see patient? Hx MI since your last visit.    Message left to return call to [place] at [phone].

## 2013-03-12 NOTE — Telephone Encounter (Signed)
Yes need chart

## 2013-03-12 NOTE — Telephone Encounter (Signed)
Pt is returning a call to Tracy °

## 2013-03-12 NOTE — Telephone Encounter (Signed)
Message left to return call to Karina Richards at [phone].    

## 2013-03-12 NOTE — Telephone Encounter (Signed)
Patient has not had menses in three months.

## 2013-03-13 NOTE — Telephone Encounter (Signed)
Spoke with patient and appointment given. 

## 2013-03-13 NOTE — Telephone Encounter (Signed)
Patient was returning Tracys call

## 2013-03-19 ENCOUNTER — Encounter: Payer: Self-pay | Admitting: Interventional Cardiology

## 2013-03-19 ENCOUNTER — Ambulatory Visit (INDEPENDENT_AMBULATORY_CARE_PROVIDER_SITE_OTHER): Payer: BC Managed Care – PPO | Admitting: Certified Nurse Midwife

## 2013-03-19 ENCOUNTER — Encounter: Payer: Self-pay | Admitting: Certified Nurse Midwife

## 2013-03-19 ENCOUNTER — Ambulatory Visit (INDEPENDENT_AMBULATORY_CARE_PROVIDER_SITE_OTHER): Payer: BC Managed Care – PPO | Admitting: Interventional Cardiology

## 2013-03-19 VITALS — BP 112/68 | HR 62 | Ht 66.0 in | Wt 125.0 lb

## 2013-03-19 VITALS — BP 104/68 | HR 68 | Resp 16 | Ht 66.25 in | Wt 124.0 lb

## 2013-03-19 DIAGNOSIS — N912 Amenorrhea, unspecified: Secondary | ICD-10-CM

## 2013-03-19 DIAGNOSIS — B373 Candidiasis of vulva and vagina: Secondary | ICD-10-CM

## 2013-03-19 DIAGNOSIS — E785 Hyperlipidemia, unspecified: Secondary | ICD-10-CM

## 2013-03-19 DIAGNOSIS — I2510 Atherosclerotic heart disease of native coronary artery without angina pectoris: Secondary | ICD-10-CM | POA: Insufficient documentation

## 2013-03-19 DIAGNOSIS — I252 Old myocardial infarction: Secondary | ICD-10-CM

## 2013-03-19 DIAGNOSIS — Z8719 Personal history of other diseases of the digestive system: Secondary | ICD-10-CM | POA: Insufficient documentation

## 2013-03-19 DIAGNOSIS — N951 Menopausal and female climacteric states: Secondary | ICD-10-CM

## 2013-03-19 LAB — POCT URINE PREGNANCY: Preg Test, Ur: NEGATIVE

## 2013-03-19 MED ORDER — NYSTATIN-TRIAMCINOLONE 100000-0.1 UNIT/GM-% EX CREA: 1.0000 "application " | TOPICAL_CREAM | Freq: Two times a day (BID) | CUTANEOUS | Status: DC

## 2013-03-19 NOTE — Progress Notes (Signed)
Patient ID: Karina Richards, female   DOB: 1966-03-20, 47 y.o.   MRN: [id number]    [address] [place], [place]  [address] Phone: [phone] Fax:  [phone]  Date:  03/19/2013   ID:  Karina Richards, DOB July 23, 1965, MRN [id number]  PCP:  Karina Pandy, MD   ASSESSMENT:  1. 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 She is currently asymptomatic without heart failure or anginal complaints. 2. Hyperlipidemia, on therapy with no followup laboratory data 3. Oral ulcers 4. Diastolic heart failure, resolved  PLAN:  1. Fasting liver and lipid panel 2. Discontinue lisinopril 3. Clinical followup in 6 months   SUBJECTIVE: Karina Richards is a 47 y.o. female who is doing well. She is back at work at [employer]. She denies chest discomfort in any fashion similar to that which accompanied her myocardial infarction. She is physically active. She bruises on the combination of Effient and aspirin. She has been troubled by oral ulcers and wonders if her cardiac condition could in any way be tied to this. She has not had lightheadedness, dizziness, or syncope. She denies palpitations. She is back to her full physical activity.   Wt Readings from Last 3 Encounters:  03/19/13 125 lb (56.7 kg)  03/19/13 124 lb (56.246 kg)  01/24/13 124 lb (56.246 kg)     Past Medical History  Diagnosis Date  . Herpes simplex type 1 infection   . Coronary artery disease   . Myocardial infarction   . CHF (congestive heart failure)     Current Outpatient Prescriptions  Medication Sig Dispense Refill  . aspirin EC 81 MG EC tablet Take 1 tablet (81 mg total) by mouth daily.      Marland Kitchen atorvastatin (LIPITOR) 40 MG tablet Take 1 tablet (40 mg total) by mouth daily at 6 PM.  30 tablet  11  . Cholecalciferol LIQD Take 4,000 Units by mouth daily. Uses one drop on the tongue each morning      . lisinopril (PRINIVIL,ZESTRIL) 2.5 MG tablet Take 1 tablet (2.5 mg total) by  mouth daily.  30 tablet  11  . metoprolol tartrate (LOPRESSOR) 12.5 mg TABS tablet Take 0.5 tablets (12.5 mg total) by mouth 2 (two) times daily.  60 tablet  11  . nitroGLYCERIN (NITROSTAT) 0.4 MG SL tablet Place 1 tablet (0.4 mg total) under the tongue every 5 (five) minutes x 3 doses as needed for chest pain.  25 tablet  12  . NYSTATIN PO Take by mouth as needed.      . nystatin-triamcinolone (MYCOLOG II) cream Apply 1 application topically 2 (two) times daily. Apply to affected area BID for up to 7 days.  60 g  0  . prasugrel (EFFIENT) 10 MG TABS tablet Take 1 tablet (10 mg total) by mouth daily.  30 tablet  11  . UNABLE TO FIND Ultra jarro-dophilus twice daily       No current facility-administered medications for this visit.    Allergies:   No Known Allergies  Social History:  The patient  reports that she has never smoked. She does not have any smokeless tobacco history on file. She reports that she does not drink alcohol or use illicit drugs.    ROS:  Please see the history of present illness.   No obvious medication side effects   All other systems reviewed and negative.   OBJECTIVE: VS:  BP 112/68  Pulse 62  Ht 5\' 6"  (1.676 m)  Wt 125 lb (56.7 kg)  BMI 20.19 kg/m2  LMP 12/05/2012 Well nourished, well developed, in no acute distress, young, healthy-appearing HEENT: normal Neck: JVD flat. Carotid bruit absent  Cardiac:  normal S1, S2; RRR; no murmur Lungs:  clear to auscultation bilaterally, no wheezing, rhonchi or rales Abd: soft, nontender, no hepatomegaly Ext: Edema absent. Pulses 2+ Skin: warm and dry Neuro:  CNs 2-12 intact, no focal abnormalities noted  EKG:  Not repeated       Signed, Darci Needle III, MD 03/19/2013 4:28 PM

## 2013-03-19 NOTE — Patient Instructions (Signed)
Stop Lisinopril   Take all other medications as prescribed  Your physician recommends that you return for a FASTING lipid profile and ALT on 03/27/13  Your physician wants you to follow-up in: 6 months You will receive a reminder letter in the mail two months in advance. If you don't receive a letter, please call our office to schedule the follow-up appointment.

## 2013-03-19 NOTE — Progress Notes (Signed)
47 y.o.MarriedCaucasian female [phone] with a 2 day(s) history of the following:local irritation and vulvar itching Sexually active: yes Last sexual activity:8days ago. Pt also reports the following associated symptoms: none Patient has nottried over the counter treatment with no relief. Patient had a MI 10/14, with stent placement. Now on medications for hypertension, and for recent cardiac stent   placement. Also being treated for ulcers in mouth by dentist. Patient's mother died in December 09, 2022 and patient just feeling like the last 4 months have been a blur. Patient scheduled appointment because she had not started period in past 3 months. LMP  12/05/2012. She is having occasional hot flashes and night sweats, but can't tell if their related to anything, due to the  medications she is on. Denies any vaginal dryness. Now has new position at [employer] with more responsibility!  O: Healthy thin female, WD Affect: normal, orientation x 3     Exam:  AVW:[id number], Urethra, Skene's normal, increased pink on vulva with exudate, and scaling, no lesions, slightly tender, Wet prep taken                Vag:no lesions, discharge: normal and physiologic, pH 4.0, wet prep done                Cx:  normal appearance and non tender                Uterus:normal size, non-tender, normal shape and consistency                Adnexa: normal adnexa and no mass, fullness, tenderness  Wet Prep shows:positive for yeast external only, negative for trich or BV   A:Menopausal symptoms with amenorrhea with normal pelvic exam Yeast vulvitis Recent MI with stent placement under follow up Oral ulcers under treatment with dentist   P: Reviewed expectations with perimenopausal changes and also how thyroid affects cycle. Patient had TSH with PCP normal per patient. Lab FSH. Discussed patient possible Provera challenge if needed. Patient to be notified of result and recommendations. Reviewed findings and given Rx Mycolog with instructions,  see order. Aveeno sitz bath prn comfort Continue follow up as indicated.   Rv prn as above

## 2013-03-20 ENCOUNTER — Ambulatory Visit: Payer: BC Managed Care – PPO | Admitting: Interventional Cardiology

## 2013-03-21 ENCOUNTER — Other Ambulatory Visit: Payer: Self-pay | Admitting: Certified Nurse Midwife

## 2013-03-21 DIAGNOSIS — N912 Amenorrhea, unspecified: Secondary | ICD-10-CM

## 2013-03-21 MED ORDER — MEDROXYPROGESTERONE ACETATE 10 MG PO TABS: 10.0000 mg | ORAL_TABLET | Freq: Every day | ORAL | Status: DC

## 2013-03-21 NOTE — Progress Notes (Signed)
Reviewed personally.  M. Suzanne Jathniel Smeltzer, MD.  

## 2013-03-27 ENCOUNTER — Telehealth: Payer: Self-pay | Admitting: Interventional Cardiology

## 2013-03-27 ENCOUNTER — Ambulatory Visit (INDEPENDENT_AMBULATORY_CARE_PROVIDER_SITE_OTHER): Payer: BC Managed Care – PPO | Admitting: *Deleted

## 2013-03-27 DIAGNOSIS — E785 Hyperlipidemia, unspecified: Secondary | ICD-10-CM

## 2013-03-27 LAB — LIPID PANEL
Cholesterol: 271 mg/dL — ABNORMAL HIGH (ref 0–200)
HDL: 58.9 mg/dL (ref >39.00)
Total CHOL/HDL Ratio: 5
Triglycerides: 47 mg/dL (ref 0.0–149.0)
VLDL: 9.4 mg/dL (ref 0.0–40.0)

## 2013-03-27 LAB — LDL CHOLESTEROL, DIRECT: Direct LDL: 202.3 mg/dL

## 2013-03-27 NOTE — Telephone Encounter (Signed)
Pt needs to get letter from dr for job, please call pt and verify with them.

## 2013-03-28 NOTE — Telephone Encounter (Signed)
Pt came into the office on 03/27/13. Pt sts that she had mailed her money order less than a week ago to [employer] for the completion  disability forms.adv pt she could f/u with [employer] directly for the status of her forms. She verbalized understanding

## 2013-04-11 ENCOUNTER — Telehealth: Payer: Self-pay

## 2013-04-11 NOTE — Telephone Encounter (Signed)
pt returned call.pt disability paperwork is completed and ready for pick up.left at the front desk for pt to pick up.pt aware.

## 2013-04-11 NOTE — Telephone Encounter (Signed)
lmom for pt to return call.pt disability form completed and ready for pick up

## 2013-04-12 ENCOUNTER — Telehealth: Payer: Self-pay

## 2013-04-12 NOTE — Telephone Encounter (Signed)
pt given fasting lab results.pt total cholesterol is elevated.pt sts that she has not been taking atorvastatin 40mg  daily.adv adv pt Dr.Smith would like for her to start atorvastatin asap and repeat fastind labs in 6 wks.lab appt sch for 05/22/13.pt verbalized understanding.

## 2013-04-12 NOTE — Telephone Encounter (Signed)
Message copied by Lamar Laundry on Thu Apr 12, 2013  8:28 AM ------      Message from: Daneen Schick      Created: Sun Apr 08, 2013  3:26 PM       Verify that she is on therapy with atorvastatin. Cholesterol is still high on therapy. Increase the atorvastatin to 80 mg daily and recheck FLP and Alt in 6 weeks. ------

## 2013-04-26 ENCOUNTER — Telehealth: Payer: Self-pay | Admitting: *Deleted

## 2013-04-26 ENCOUNTER — Other Ambulatory Visit: Payer: Self-pay | Admitting: *Deleted

## 2013-04-26 MED ORDER — ATORVASTATIN CALCIUM 40 MG PO TABS: 40.0000 mg | ORAL_TABLET | Freq: Every day | ORAL | Status: DC

## 2013-04-26 MED ORDER — PRASUGREL HCL 10 MG PO TABS: 10.0000 mg | ORAL_TABLET | Freq: Every day | ORAL | Status: DC

## 2013-04-26 NOTE — Telephone Encounter (Signed)
returned pt call pt sts that she has been taking 1/2 tablet of 25mg  metoprolol qd.pt doasage on hosp discharge sts that pt is taking 12.5mg  1/2 tablet bid.adv pt to continue to take the way she been taking, and I will confirm correct dosage with Dr.Smith.pt verbalized understanding.

## 2013-04-26 NOTE — Telephone Encounter (Signed)
Patient called for metoprolol refill, but she stated that she takes 0.5 of a 25mg  tablet daily qd and the chart says otherwise. Please advise on how the patient is to be taking this so that I can refill it appropriately. Thanks, MI

## 2013-05-03 ENCOUNTER — Telehealth: Payer: Self-pay | Admitting: Interventional Cardiology

## 2013-05-03 NOTE — Telephone Encounter (Signed)
pt has been taking below the min dosage of Metoprolol.per Dr.Smith [facility] for pt to stop metoprolol.pt aware and verbalized understanding.

## 2013-05-03 NOTE — Telephone Encounter (Signed)
New message  Patient is taking Metorolol 1/2 a tablet a day, she wants to know if she is to continue the 1/2 tablet? Please call and advise.

## 2013-05-18 ENCOUNTER — Telehealth: Payer: Self-pay | Admitting: Interventional Cardiology

## 2013-05-18 NOTE — Telephone Encounter (Signed)
Returned call to patient she stated she will be having fasting lipid and liver panels next week at [facility] and will have faxed to [place].

## 2013-05-18 NOTE — Telephone Encounter (Signed)
New Message  Pt called states that she has an appointment with [facility] will have a lab completed there, she requesting a call back to discuss if Our office can use the labs drawn from their facility.. Please assist

## 2013-05-21 ENCOUNTER — Ambulatory Visit (INDEPENDENT_AMBULATORY_CARE_PROVIDER_SITE_OTHER): Payer: BC Managed Care – PPO | Admitting: Certified Nurse Midwife

## 2013-05-21 ENCOUNTER — Encounter: Payer: Self-pay | Admitting: Certified Nurse Midwife

## 2013-05-21 VITALS — BP 119/84 | HR 75 | Resp 16 | Ht 66.25 in | Wt 125.0 lb

## 2013-05-21 DIAGNOSIS — Z01419 Encounter for gynecological examination (general) (routine) without abnormal findings: Secondary | ICD-10-CM

## 2013-05-21 DIAGNOSIS — Z0000 Encounter for general adult medical examination without abnormal findings: Secondary | ICD-10-CM

## 2013-05-21 LAB — COMPREHENSIVE METABOLIC PANEL
ALBUMIN: 4.9 g/dL (ref 3.5–5.2)
ALT: 20 U/L (ref 0–35)
AST: 23 U/L (ref 0–37)
Alkaline Phosphatase: 49 U/L (ref 39–117)
BILIRUBIN TOTAL: 2 mg/dL — AB (ref 0.2–1.2)
BUN: 9 mg/dL (ref 6–23)
CO2: 28 meq/L (ref 19–32)
Calcium: 9.5 mg/dL (ref 8.4–10.5)
Chloride: 98 mEq/L (ref 96–112)
Creat: 0.88 mg/dL (ref 0.50–1.10)
Glucose, Bld: 84 mg/dL (ref 70–99)
POTASSIUM: 3.9 meq/L (ref 3.5–5.3)
SODIUM: 135 meq/L (ref 135–145)
TOTAL PROTEIN: 7.3 g/dL (ref 6.0–8.3)

## 2013-05-21 LAB — POCT URINALYSIS DIPSTICK
Bilirubin, UA: NEGATIVE
Blood, UA: NEGATIVE
Glucose, UA: NEGATIVE
KETONES UA: NEGATIVE
LEUKOCYTES UA: NEGATIVE
Nitrite, UA: NEGATIVE
PROTEIN UA: NEGATIVE
Urobilinogen, UA: NEGATIVE
pH, UA: 5

## 2013-05-21 LAB — TSH: TSH: 1.521 u[IU]/mL (ref 0.350–4.500)

## 2013-05-21 LAB — HEMOGLOBIN, FINGERSTICK: Hemoglobin, fingerstick: 14.1 g/dL (ref 12.0–16.0)

## 2013-05-21 LAB — LIPID PANEL
Cholesterol: 161 mg/dL (ref 0–200)
HDL: 50 mg/dL (ref >39)
LDL Cholesterol: 97 mg/dL (ref 0–99)
Total CHOL/HDL Ratio: 3.2 Ratio
Triglycerides: 71 mg/dL (ref <150)
VLDL: 14 mg/dL (ref 0–40)

## 2013-05-21 NOTE — Patient Instructions (Signed)

## 2013-05-21 NOTE — Progress Notes (Signed)
48 y.o. [id number] Married Caucasian Fe here for annual exam. Periods occurred after Provera use, heavier, but 5 days only. Normal period 04/29/13. Keeping meses calendar and aware of need to advise if no period in 3 months.Marland KitchenContinues to see cardiologist and is on stable medications. Now off hypertension medication, but continues on Lipitor and aspirin. Sees cardiology every 3 months now. No gyn health issues today. Fasted today for labs.  Patient's last menstrual period was 04/29/2013.          Sexually active: yes  The current method of family planning is condoms all the time.    Exercising: yes  walk & cardio Smoker:  no  Health Maintenance: Pap: 05-17-12 neg HPV neg MMG:  3/14 Colonoscopy:  none BMD:   none TDaP:  2010 Labs: Poct urine-neg, Hgb-14.1 Self breast exam: not done   reports that she has never smoked. She does not have any smokeless tobacco history on file. She reports that she drinks about 1.0 ounces of alcohol per week. She reports that she does not use illicit drugs.  Past Medical History  Diagnosis Date  . Herpes simplex type 1 infection   . Coronary artery disease   . Myocardial infarction   . CHF (congestive heart failure)     Past Surgical History  Procedure Laterality Date  . Inguinal hernia repair Right 2008  . Cardiac catheterization    . Coronary angioplasty      Current Outpatient Prescriptions  Medication Sig Dispense Refill  . aspirin EC 81 MG EC tablet Take 1 tablet (81 mg total) by mouth daily.      Marland Kitchen atorvastatin (LIPITOR) 40 MG tablet Take 1 tablet (40 mg total) by mouth daily at 6 PM.  90 tablet  1  . Cholecalciferol LIQD Take 4,000 Units by mouth daily. Uses one drop on the tongue each morning      . prasugrel (EFFIENT) 10 MG TABS tablet Take 1 tablet (10 mg total) by mouth daily.  90 tablet  1  . nitroGLYCERIN (NITROSTAT) 0.4 MG SL tablet Place 1 tablet (0.4 mg total) under the tongue every 5 (five) minutes x 3 doses as needed for chest pain.  25  tablet  12   No current facility-administered medications for this visit.    Family History  Problem Relation Age of Onset  . Osteoporosis Mother   . Heart disease Mother   . COPD Mother   . Heart disease Father     ROS:  Pertinent items are noted in HPI.  Otherwise, a comprehensive ROS was negative.  Exam:   BP 119/84  Pulse 75  Resp 16  Ht 5' 6.25" (1.683 m)  Wt 125 lb (56.7 kg)  BMI 20.02 kg/m2  LMP 04/29/2013 Height: 5' 6.25" (168.3 cm)  Ht Readings from Last 3 Encounters:  05/21/13 5' 6.25" (1.683 m)  03/19/13 5\' 6"  (1.676 m)  03/19/13 5' 6.25" (1.683 m)    General appearance: alert, cooperative and appears stated age Head: Normocephalic, without obvious abnormality, atraumatic Neck: no adenopathy, supple, symmetrical, trachea midline and thyroid normal to inspection and palpation Lungs: clear to auscultation bilaterally Breasts: normal appearance, no masses or tenderness, No nipple retraction or dimpling, No nipple discharge or bleeding, No axillary or supraclavicular adenopathy Heart: regular rate and rhythm Abdomen: soft, non-tender; no masses,  no organomegaly Extremities: extremities normal, atraumatic, no cyanosis or edema Skin: Skin color, texture, turgor normal. No rashes or lesions Lymph nodes: Cervical, supraclavicular, and axillary nodes normal. No abnormal  inguinal nodes palpated Neurologic: Grossly normal   Pelvic: External genitalia:  no lesions              Urethra:  normal appearing urethra with no masses, tenderness or lesions              Bartholin's and Skene's: normal                 Vagina: normal appearing vagina with normal color and discharge, no lesions              Cervix: normal, non tender              Pap taken: yes Bimanual Exam:  Uterus:  normal size, contour, position, consistency, mobility, non-tender and anteverted              Adnexa: normal adnexa and no mass, fullness, tenderness               Rectovaginal: Confirms                Anus:  normal sphincter tone, no lesions  A:  Well Woman with normal exam  Perimenopausal with normal period now  History of MI with stent surgery still under cardiac follow up  P:   Reviewed health and wellness pertinent to exam  Discussed perimenopausal etiology, expectations with periods and changes that can occur. Questions addressed. Given new menses calendar to record and aware of need to notify if no period in 3 months.   Continue follow up as indicated with cardiology  Pap smear as per guidelines   Mammogram yearly  Colonoscopy risks and benefits discussed, patient plans to schedule later in the year. Will call for referral when ready. No family history of colon cancer. pap smear taken today with reflex  counseled on breast self exam, mammography screening, menopause, adequate intake of calcium and vitamin D, diet and exercise return annually or prn  An After Visit Summary was printed and given to the patient.

## 2013-05-22 ENCOUNTER — Other Ambulatory Visit: Payer: BC Managed Care – PPO

## 2013-05-22 LAB — VITAMIN D 25 HYDROXY (VIT D DEFICIENCY, FRACTURES): VIT D 25 HYDROXY: 70 ng/mL (ref 30–89)

## 2013-05-22 NOTE — Progress Notes (Signed)
Reviewed personally.  M. Suzanne Sibel Khurana, MD.  

## 2013-05-23 LAB — IPS PAP TEST WITH REFLEX TO HPV

## 2013-05-25 ENCOUNTER — Telehealth: Payer: Self-pay | Admitting: Interventional Cardiology

## 2013-05-25 DIAGNOSIS — R17 Unspecified jaundice: Secondary | ICD-10-CM

## 2013-05-25 NOTE — Telephone Encounter (Signed)
New message     Had blood work at [facility] on Tuesday for her cholesterol.  They are on epic.  You can get the results off epic

## 2013-05-30 NOTE — Telephone Encounter (Signed)
pt adv that her recent labs done with her Gyn were reviewed by Dr.Smith.pt billirubin was elevated at 2.0. DR.Tamala Julian would like her to hace a direct/indirect billirubin drawn.pt will come in on 06/01/13 for lab.pt agreeable with plan and verbalized understanding.

## 2013-06-01 ENCOUNTER — Other Ambulatory Visit: Payer: BC Managed Care – PPO

## 2013-06-06 ENCOUNTER — Other Ambulatory Visit: Payer: BC Managed Care – PPO

## 2013-06-06 DIAGNOSIS — R17 Unspecified jaundice: Secondary | ICD-10-CM

## 2013-06-06 LAB — BILIRUBIN,DIRECT & INDIRECT (FRACTIONATED)
BILIRUBIN INDIRECT: 1.3 mg/dL — AB (ref 0.2–1.2)
Bilirubin, Direct: 0.3 mg/dL (ref 0.0–0.3)

## 2013-06-07 ENCOUNTER — Telehealth: Payer: Self-pay

## 2013-06-07 NOTE — Telephone Encounter (Signed)
Message copied by Lamar Laundry on Thu Jun 07, 2013  2:41 PM ------      Message from: Daneen Schick      Created: Thu Jun 07, 2013 11:36 AM       This is essentially normal. 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

## 2013-06-07 NOTE — Telephone Encounter (Signed)
lmom.This is essentially normal. 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

## 2013-06-25 ENCOUNTER — Telehealth: Payer: Self-pay | Admitting: Interventional Cardiology

## 2013-06-25 NOTE — Telephone Encounter (Signed)
New message  Patient wants to know if she can take CO-Q10 with her other medications? Please call and advise.

## 2013-06-26 ENCOUNTER — Other Ambulatory Visit: Payer: Self-pay

## 2013-06-26 NOTE — Telephone Encounter (Signed)
pt adv ok to take Co Q10.pt verbalized understanding.

## 2013-10-21 ENCOUNTER — Other Ambulatory Visit: Payer: Self-pay | Admitting: Interventional Cardiology

## 2013-11-01 ENCOUNTER — Other Ambulatory Visit: Payer: Self-pay | Admitting: Interventional Cardiology

## 2013-11-04 ENCOUNTER — Other Ambulatory Visit: Payer: Self-pay | Admitting: Interventional Cardiology

## 2013-11-07 ENCOUNTER — Other Ambulatory Visit: Payer: Self-pay | Admitting: Interventional Cardiology

## 2013-11-19 ENCOUNTER — Telehealth: Payer: Self-pay | Admitting: Certified Nurse Midwife

## 2013-11-19 NOTE — Telephone Encounter (Signed)
Needs ov

## 2013-11-19 NOTE — Telephone Encounter (Signed)
Pt states she has not had a cycle since April,13 2015 wants to see DL

## 2013-11-19 NOTE — Telephone Encounter (Signed)
Spoke with patient. Patient states that she has not had a cycle since April 13th. States she was always told to call if no cycle for three months. Patient took Provera in February with withdrawal bleed. Patient would like to know what she needs to do now. Advised patient would send a message over to [place] as she may want to start another provera challenge or may want her to come in for office visit. Advised would speak with Regina Eck CNM and give her a call back with further recommendations and instructions. Patient agreeable.

## 2013-11-20 NOTE — Telephone Encounter (Signed)
Spoke with patient. Advised of message as seen below from [place]. Patient agreeable. Requesting to schedule for the week of Sept 7th. Appointment scheduled for Sept 8th at 11:15am with Regina Eck CNM. Patient agreeable to date and time.  Routing to provider for final review. Patient agreeable to disposition. Will close encounter

## 2013-11-27 ENCOUNTER — Ambulatory Visit (INDEPENDENT_AMBULATORY_CARE_PROVIDER_SITE_OTHER): Payer: BC Managed Care – PPO | Admitting: Physician Assistant

## 2013-11-27 ENCOUNTER — Encounter: Payer: Self-pay | Admitting: Physician Assistant

## 2013-11-27 VITALS — BP 130/88 | HR 65 | Ht 66.25 in | Wt 125.0 lb

## 2013-11-27 DIAGNOSIS — I2510 Atherosclerotic heart disease of native coronary artery without angina pectoris: Secondary | ICD-10-CM

## 2013-11-27 DIAGNOSIS — I255 Ischemic cardiomyopathy: Secondary | ICD-10-CM

## 2013-11-27 DIAGNOSIS — I2589 Other forms of chronic ischemic heart disease: Secondary | ICD-10-CM

## 2013-11-27 DIAGNOSIS — E785 Hyperlipidemia, unspecified: Secondary | ICD-10-CM

## 2013-11-27 MED ORDER — ATORVASTATIN CALCIUM 40 MG PO TABS: 40.0000 mg | ORAL_TABLET | Freq: Every day | ORAL | Status: DC

## 2013-11-27 MED ORDER — PRASUGREL HCL 10 MG PO TABS: 10.0000 mg | ORAL_TABLET | Freq: Every day | ORAL | Status: DC

## 2013-11-27 NOTE — Patient Instructions (Signed)
Your physician wants you to follow-up in: [place] Tamala Julian. You will receive a reminder letter in the mail two months in advance. If you don't receive a letter, please call our office to schedule the follow-up appointment.   REFILLS SENT IN FOR EFFIENT AND LIPITOR # 28 X 3  Your physician recommends that you continue on your current medications as directed. Please refer to the Current Medication list given to you today.

## 2013-11-27 NOTE — Progress Notes (Signed)
Cardiology Office Note    Date:  11/27/2013   ID:  Karina Richards, DOB Nov 09, 1965, MRN [id number]  PCP:  Manon Hilding, MD  Cardiologist:  Dr. Daneen Schick      History of Present Illness: Karina Richards is a 48 y.o. 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  She had dyspnea 2/2 Brilinta and was switched to Effient.  Last seen by Dr. Daneen Schick in 02/2013.  She returns for FU.  She is doing well. The patient denies chest pain, shortness of breath, syncope, orthopnea, PND or significant pedal edema.    Studies:  - LHC (10/14):  Ostial LM 20%, LAD occluded with appearance of spontaneous dissection, EF 40% with apical akinesis/dyskinesis >>> PCI:  Xience Xpedition 2.5 x 15 mm long DES  - Echo (10/14):  EF 45-50%, apical AK, Gr 1 DD, no apical clot, trivial AI  Recent Labs/Images: 01/01/2013: Hemoglobin 12.8  01/02/2013: Pro B Natriuretic peptide (BNP) 3242.0*  03/27/2013: Direct LDL 202.3  05/21/2013: ALT 20; Creatinine 0.88; HDL Cholesterol by NMR 50; LDL (calc) 97; Potassium 3.9; TSH 1.521   Wt Readings from Last 3 Encounters:  11/27/13 125 lb (56.7 kg)  05/21/13 125 lb (56.7 kg)  03/19/13 125 lb (56.7 kg)     Past Medical History  Diagnosis Date  . Herpes simplex type 1 infection   . Coronary artery disease   . Myocardial infarction   . CHF (congestive heart failure)     Current Outpatient Prescriptions  Medication Sig Dispense Refill  . aspirin EC 81 MG EC tablet Take 1 tablet (81 mg total) by mouth daily.      Marland Kitchen atorvastatin (LIPITOR) 40 MG tablet TAKE 1 TABLET BY MOUTH EVERY EVENING AT 6PM  90 tablet  0  . Cholecalciferol LIQD Take 4,000 Units by mouth daily. Uses one drop on the tongue each morning      . EFFIENT 10 MG TABS tablet TAKE 1 TABLET BY MOUTH DAILY  30 tablet  0  . Folic Acid POWD Take 1 Package by mouth daily.      . Milk Thistle 250 MG CAPS  Take 250 mg by mouth daily.      . nitroGLYCERIN (NITROSTAT) 0.4 MG SL tablet Place 1 tablet (0.4 mg total) under the tongue every 5 (five) minutes x 3 doses as needed for chest pain.  25 tablet  12  . NON FORMULARY Take 1 tablet by mouth daily. RAINBOW LIGHT A MAGNESIUM / CALCIUM CHEWABLE TABLET       No current facility-administered medications for this visit.     Allergies:   Review of patient's allergies indicates no known allergies.   Social History:  The patient  reports that she has never smoked. She does not have any smokeless tobacco history on file. She reports that she drinks about one ounce of alcohol per week. She reports that she does not use illicit drugs.   Family History:  The patient's family history includes COPD in her mother; Heart attack in her maternal grandmother; Heart disease in her father and mother; Osteoporosis in her mother.   ROS:  Please see the history of present illness.   No bleeding problems.    All other systems reviewed and negative.   PHYSICAL EXAM: VS:  BP 130/88  Pulse 65  Ht 5' 6.25" (1.683 m)  Wt 125 lb (56.7 kg)  BMI 20.02  kg/m2 Well nourished, well developed, in no acute distress HEENT: normal Neck: no JVD Cardiac:  normal S1, S2; RRR; no murmur Lungs:  clear to auscultation bilaterally, no wheezing, rhonchi or rales Abd: soft, nontender, no hepatomegaly Ext: no edema Skin: warm and dry Neuro:  CNs 2-12 intact, no focal abnormalities noted  EKG:  NSR, HR 65, normal axis, NSSTTW changes     ASSESSMENT AND PLAN:  1. CAD S/P ANTEROAPICAL STEMI 12/2012 DUE TO SPONTANEOUS DISSECTION:  No angina.  Continue ASA, Effient, statin.  I will review with Dr. Daneen Schick regarding the duration of DAP therapy (12 mos vs 30 mos).   2. Ischemic cardiomyopathy:  No evidence of volume excess.  She has had some intolerances to medications.   Dr. Tamala Julian has stopped her ACEI and beta blocker.   3. HYPERLIPIDEMIA:  Continue statin.  Recent lipid panel  demonstrated > 50% reduction in LDL.     Disposition:  FU with Dr. Daneen Schick in 6 mos.    Signed, Versie Starks, MHS 11/27/2013 10:36 AM    [facility] [address], [place], [place]  [address] Phone: [phone]; Fax: [phone]

## 2013-12-04 ENCOUNTER — Ambulatory Visit: Payer: BC Managed Care – PPO | Admitting: Certified Nurse Midwife

## 2013-12-04 ENCOUNTER — Telehealth: Payer: Self-pay | Admitting: Certified Nurse Midwife

## 2013-12-04 NOTE — Telephone Encounter (Signed)
Patient canceled her "no menses since April" appointment. Patient says she tried to return our reminder text canceling the appointment. Patient says she called yesterday and got our answering machine. Patient did no leave a message. I informed the patient we do not take incoming text and in the future it is okay to leave a message canceling her appointment. Patient rescheduled to 12/07/13.

## 2013-12-07 ENCOUNTER — Ambulatory Visit (INDEPENDENT_AMBULATORY_CARE_PROVIDER_SITE_OTHER): Payer: BC Managed Care – PPO | Admitting: Certified Nurse Midwife

## 2013-12-07 ENCOUNTER — Encounter: Payer: Self-pay | Admitting: Certified Nurse Midwife

## 2013-12-07 VITALS — BP 120/86 | HR 64 | Resp 18 | Ht 66.25 in | Wt 123.0 lb

## 2013-12-07 DIAGNOSIS — N912 Amenorrhea, unspecified: Secondary | ICD-10-CM

## 2013-12-07 DIAGNOSIS — N951 Menopausal and female climacteric states: Secondary | ICD-10-CM

## 2013-12-07 LAB — POCT URINE PREGNANCY: Preg Test, Ur: NEGATIVE

## 2013-12-07 NOTE — Patient Instructions (Signed)

## 2013-12-07 NOTE — Progress Notes (Signed)
  48 y.o.Married Caucasian female presents with no menses since July 09 2013.  Bleeding is normal with periods in past.  Periods were regular in the past, occurring every 28 days. Patient has been keeping menses record for the past year. Patient reports night sweats and hot flashes . Some vaginal dryness, but not a problem. Reports condoms consistent for contraception use.Urine pregnancy test today negative.    O: Healthy female, WDWN Affect normal, orientation x 3  Exam:Thyroid normal no enlargement or nodules  Abdomen soft, no masses Pelvic Exam: External genital area normal EXH:[id number] Vagina: normal, normal discharge Cervix: normal appearance, non tender, no lesions Uterus: anteverted, normal, non tender Adnexa: normal, no masses, non tender   Assessment: Normal pelvic exam  Perimenopausal with amenorrhea  Plan:Reviewed normal pelvic exam.  Discussed with patient factors that may be contributory to menstrual abnormalities include thyroid changes, pituitary changes and perimenopause. Recommend Lab work. Patient agreeable. Previous treatments for menstrual abnormalities include progesterone, effective one year ago.Discussed would recommend Provera challenge again once labs are in. Stressed importance of consistent condom use. Continue to keep menses calendar. Labs:TSH,FSH,Prolactin  Rv prn

## 2013-12-08 LAB — PROLACTIN: Prolactin: 14.8 ng/mL

## 2013-12-08 LAB — FOLLICLE STIMULATING HORMONE: FSH: 174.8 m[IU]/mL — AB

## 2013-12-08 LAB — TSH: TSH: 1.043 u[IU]/mL (ref 0.350–4.500)

## 2013-12-12 MED ORDER — MEDROXYPROGESTERONE ACETATE 10 MG PO TABS: 10.0000 mg | ORAL_TABLET | Freq: Every day | ORAL | Status: DC

## 2013-12-14 ENCOUNTER — Telehealth: Payer: Self-pay

## 2013-12-14 NOTE — Telephone Encounter (Signed)
Pt wants to know if we have received results from blood test from last week.

## 2013-12-14 NOTE — Telephone Encounter (Signed)
Spoke with patient. Advised of results as seen below from [place]. Patient is agreeable and verbalizes understanding. Will start Provera today and call two weeks after completion with positive or negative bleeding.  Notes Recorded by Regina Eck, CNM on 12/12/2013 at 7:11 PM Notify patient that [facility] indicates menopausal range, she probably will not have any more bleeding but could and not menopausal until one year without bleeding Needs to take Provera once again and report if response or not With bleeding Order in Prolactin and TSH normal  Routing to provider for final review. Patient agreeable to disposition. Will close encounter

## 2013-12-14 NOTE — Telephone Encounter (Signed)
Encounter opened in error. See previous telephone call from 9/18.

## 2013-12-17 NOTE — Progress Notes (Signed)
Reviewed personally.  M. Suzanne Emmersen Garraway, MD.  

## 2014-01-28 ENCOUNTER — Encounter: Payer: Self-pay | Admitting: Certified Nurse Midwife

## 2014-02-04 ENCOUNTER — Telehealth: Payer: Self-pay | Admitting: Certified Nurse Midwife

## 2014-02-04 NOTE — Telephone Encounter (Signed)
Notes Recorded by Regina Eck, CNM on 12/12/2013 at 7:11 PM Notify patient that [facility] indicates menopausal range, she probably will not have any more bleeding but could and not menopausal until one year without bleeding Needs to take Provera once again and report if response or not With bleeding Order in Prolactin and TSH normal

## 2014-02-04 NOTE — Telephone Encounter (Signed)
Spoke with patient. She states she completed provera 10 mg for 10 days about one month ago. Has not had any bleeding.  Advised would notify Regina Eck CNM.  Advised I will call patient back if needs to complete another round of provera.  Patient agreeable to this.

## 2014-02-04 NOTE — Telephone Encounter (Signed)
Patient last [place] was high menopausal range and with no bleeding, no Provera needed anymore. Have patient notify if she has any vaginal bleeding at any time prior to aex.

## 2014-02-04 NOTE — Telephone Encounter (Signed)
Pt calling to let Jackelyn Poling know that she has not had a period since starting medication.

## 2014-02-05 NOTE — Telephone Encounter (Signed)
Left message to call [name] at [phone]. 

## 2014-02-11 NOTE — Telephone Encounter (Signed)
Pt informed and voiced understanding. Pt will call if any bleeding. Encounter closed

## 2014-03-07 ENCOUNTER — Encounter (HOSPITAL_COMMUNITY): Payer: Self-pay | Admitting: Interventional Cardiology

## 2014-03-29 DIAGNOSIS — N9489 Other specified conditions associated with female genital organs and menstrual cycle: Secondary | ICD-10-CM

## 2014-03-29 HISTORY — DX: Other specified conditions associated with female genital organs and menstrual cycle: N94.89

## 2014-04-11 ENCOUNTER — Telehealth: Payer: Self-pay | Admitting: Certified Nurse Midwife

## 2014-04-11 NOTE — Telephone Encounter (Signed)
LMTCB about canceled appointment with DL

## 2014-04-23 ENCOUNTER — Telehealth: Payer: Self-pay | Admitting: *Deleted

## 2014-04-23 NOTE — Telephone Encounter (Signed)
lmptcb to schedule appt with Dr. Tamala Julian in March 2016.

## 2014-04-24 NOTE — Telephone Encounter (Signed)
Pt scheduled to see Dr. Tamala Julian 07/03/14 @ 11:30. Pt agreeable to Plan of Care.

## 2014-05-22 ENCOUNTER — Ambulatory Visit: Payer: BC Managed Care – PPO | Admitting: Certified Nurse Midwife

## 2014-06-03 ENCOUNTER — Ambulatory Visit (INDEPENDENT_AMBULATORY_CARE_PROVIDER_SITE_OTHER): Payer: BLUE CROSS/BLUE SHIELD | Admitting: Certified Nurse Midwife

## 2014-06-03 ENCOUNTER — Encounter: Payer: Self-pay | Admitting: Certified Nurse Midwife

## 2014-06-03 VITALS — BP 110/70 | HR 64 | Resp 16 | Ht 66.0 in | Wt 130.0 lb

## 2014-06-03 DIAGNOSIS — Z01419 Encounter for gynecological examination (general) (routine) without abnormal findings: Secondary | ICD-10-CM | POA: Diagnosis not present

## 2014-06-03 DIAGNOSIS — Z0000 Encounter for general adult medical examination without abnormal findings: Secondary | ICD-10-CM | POA: Diagnosis not present

## 2014-06-03 DIAGNOSIS — Z124 Encounter for screening for malignant neoplasm of cervix: Secondary | ICD-10-CM | POA: Diagnosis not present

## 2014-06-03 DIAGNOSIS — Z780 Asymptomatic menopausal state: Secondary | ICD-10-CM

## 2014-06-03 LAB — POCT URINALYSIS DIPSTICK
Bilirubin, UA: NEGATIVE
Blood, UA: NEGATIVE
Glucose, UA: NEGATIVE
Ketones, UA: NEGATIVE
LEUKOCYTES UA: NEGATIVE
NITRITE UA: NEGATIVE
PH UA: 5
PROTEIN UA: NEGATIVE
UROBILINOGEN UA: NEGATIVE

## 2014-06-03 NOTE — Patient Instructions (Signed)

## 2014-06-03 NOTE — Progress Notes (Signed)
49 y.o. [id number] Married  Caucasian Fe here for annual exam.  Menopausal no HRT. Denies vaginal bleeding or vaginal dryness. Using coconut for vaginal dryness. Continues follow up with cardiology with labs and medication management after MI.Last check up good per patient. 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 Now taking time out for self and is actually going to movies with pastor this pm. Feels she has definitely been taking better care of herself. No gyn health issues done.   Patient's last menstrual period was 07/09/2013.          Sexually active: Yes.    The current method of family planning is condoms Every time.    Exercising: Yes.    Walking, bike.  2 - 3 days weekly Smoker:  no  Health Maintenance: Pap:  05/21/13 Neg MMG: 06/06/13 BIRADS1:neg Colonoscopy: never BMD: never TDaP: 2010 Labs: Declined. UA:neg   reports that she has never smoked. She has never used smokeless tobacco. She reports that she drinks about 1.0 oz of alcohol per week. She reports that she does not use illicit drugs.  Past Medical History  Diagnosis Date  . Herpes simplex type 1 infection   . Coronary artery disease   . Myocardial infarction   . CHF (congestive heart failure)     Past Surgical History  Procedure Laterality Date  . Inguinal hernia repair Right 2008  . Cardiac catheterization    . Coronary angioplasty    . Left heart catheterization with coronary angiogram N/A 12/31/2012    Procedure: LEFT HEART CATHETERIZATION WITH CORONARY ANGIOGRAM;  Surgeon: Sinclair Grooms, MD;  Location: [facility] CATH LAB;  Service: Cardiovascular;  Laterality: N/A;    Current Outpatient Prescriptions  Medication Sig Dispense Refill  . aspirin EC 81 MG EC tablet Take 1 tablet (81 mg total) by mouth daily.    Marland Kitchen atorvastatin (LIPITOR) 40 MG tablet Take 1 tablet (40 mg total) by mouth at bedtime. 90 tablet 3  . Cholecalciferol LIQD Take 4,000 Units by mouth daily. Uses one drop on the  tongue each morning    . Folic Acid POWD Take 1 Package by mouth daily.    . Milk Thistle 250 MG CAPS Take 250 mg by mouth daily.    . NON FORMULARY Take 1 tablet by mouth daily. RAINBOW LIGHT A MAGNESIUM / CALCIUM CHEWABLE TABLET    . prasugrel (EFFIENT) 10 MG TABS tablet Take 1 tablet (10 mg total) by mouth daily. 90 tablet 3  . nitroGLYCERIN (NITROSTAT) 0.4 MG SL tablet Place 1 tablet (0.4 mg total) under the tongue every 5 (five) minutes x 3 doses as needed for chest pain. (Patient not taking: Reported on 06/03/2014) 25 tablet 12   No current facility-administered medications for this visit.    Family History  Problem Relation Age of Onset  . Osteoporosis Mother   . Heart disease Mother   . COPD Mother   . Heart disease Father   . Heart attack Maternal Grandmother     ROS:  Pertinent items are noted in HPI.  Otherwise, a comprehensive ROS was negative.  Exam:   BP 110/70 mmHg  Pulse 64  Resp 16  Ht 5\' 6"  (1.676 m)  Wt 130 lb (58.968 kg)  BMI 20.99 kg/m2  LMP 07/09/2013 Height: 5\' 6"  (167.6 cm) Ht Readings from Last 3 Encounters:  06/03/14 5\' 6"  (1.676 m)  12/07/13 5' 6.25" (1.683 m)  11/27/13 5' 6.25" (1.683 m)    General  appearance: alert, cooperative and appears stated age Head: Normocephalic, without obvious abnormality, atraumatic Neck: no adenopathy, supple, symmetrical, trachea midline and thyroid normal to inspection and palpation Lungs: clear to auscultation bilaterally Breasts: normal appearance, no masses or tenderness, No nipple retraction or dimpling, No nipple discharge or bleeding, No axillary or supraclavicular adenopathy Heart: regular rate and rhythm Abdomen: soft, non-tender; no masses,  no organomegaly Extremities: extremities normal, atraumatic, no cyanosis or edema Skin: Skin color, texture, turgor normal. No rashes or lesions Lymph nodes: Cervical, supraclavicular, and axillary nodes normal. No abnormal inguinal nodes palpated Neurologic: Grossly  normal   Pelvic: External genitalia:  no lesions              Urethra:  normal appearing urethra with no masses, tenderness or lesions              Bartholin's and Skene's: normal                 Vagina: normal appearing vagina with normal color and discharge, no lesions              Cervix: normal, non tender,  no lesions              Pap taken: Yes.   Bimanual Exam:  Uterus:  normal size, contour, position, consistency, mobility, non-tender              Adnexa: normal adnexa and no mass, fullness, tenderness               Rectovaginal: Confirms               Anus:  normal sphincter tone, no lesions  Chaperone present: Yes  A:  Well Woman with normal exam  Menopausal no HRT  Vaginal dryness with good result from coconut oil use  History of MI with stent under cardiology management  P:   Reviewed health and wellness pertinent to exam  Aware of need to evaluate if vaginal bleeding  Lab [facility]  Pap smear taken today with HPVHR  Continue to follow up with Cardiologist     counseled on breast self exam, mammography screening, menopause, adequate intake of calcium and vitamin D, diet and exercise   return annually or prn  An After Visit Summary was printed and given to the patient.

## 2014-06-04 NOTE — Progress Notes (Signed)
Reviewed personally.  M. Suzanne Hutton Pell[place]ane, MD.  

## 2014-06-05 LAB — IPS PAP TEST WITH HPV

## 2014-06-08 LAB — ANTI MULLERIAN HORMONE: AMH AssessR: <0.03 ng/mL

## 2014-07-03 ENCOUNTER — Encounter: Payer: Self-pay | Admitting: Interventional Cardiology

## 2014-07-03 ENCOUNTER — Ambulatory Visit (INDEPENDENT_AMBULATORY_CARE_PROVIDER_SITE_OTHER): Payer: BLUE CROSS/BLUE SHIELD | Admitting: Interventional Cardiology

## 2014-07-03 VITALS — BP 116/70 | HR 62 | Ht 66.0 in | Wt 131.0 lb

## 2014-07-03 DIAGNOSIS — E785 Hyperlipidemia, unspecified: Secondary | ICD-10-CM

## 2014-07-03 DIAGNOSIS — I5032 Chronic diastolic (congestive) heart failure: Secondary | ICD-10-CM

## 2014-07-03 DIAGNOSIS — I2510 Atherosclerotic heart disease of native coronary artery without angina pectoris: Secondary | ICD-10-CM | POA: Diagnosis not present

## 2014-07-03 LAB — LIPID PANEL
CHOLESTEROL: 144 mg/dL (ref 0–200)
HDL: 51.7 mg/dL (ref >39.00)
LDL Cholesterol: 84 mg/dL (ref 0–99)
NonHDL: 92.3
TRIGLYCERIDES: 43 mg/dL (ref 0.0–149.0)
Total CHOL/HDL Ratio: 3
VLDL: 8.6 mg/dL (ref 0.0–40.0)

## 2014-07-03 LAB — HEPATIC FUNCTION PANEL
ALK PHOS: 60 U/L (ref 39–117)
ALT: 16 U/L (ref 0–35)
AST: 18 U/L (ref 0–37)
Albumin: 4.3 g/dL (ref 3.5–5.2)
BILIRUBIN DIRECT: 0.3 mg/dL (ref 0.0–0.3)
BILIRUBIN TOTAL: 1.3 mg/dL — AB (ref 0.2–1.2)
Total Protein: 6.6 g/dL (ref 6.0–8.3)

## 2014-07-03 NOTE — Progress Notes (Signed)
Cardiology Office Note   Date:  07/03/2014   ID:  Karina Richards, DOB 03-Aug-1965, MRN [id number]  PCP:  Manon Hilding, MD  Cardiologist:   Sinclair Grooms, MD   No chief complaint on file.     History of Present Illness: Karina Richards is a 49 y.o. female who presents for CAD and lipid management. 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 She's Had No Angina since That Time. Physical Activity Has Been Unrestricted. Overall She Is Doing Quite Well.    Past Medical History  Diagnosis Date  . Herpes simplex type 1 infection   . Coronary artery disease   . Myocardial infarction   . CHF (congestive heart failure)     Past Surgical History  Procedure Laterality Date  . Inguinal hernia repair Right 2008  . Left heart catheterization with coronary angiogram N/A 12/31/2012    LAD DES     Current Outpatient Prescriptions  Medication Sig Dispense Refill  . aspirin EC 81 MG EC tablet Take 1 tablet (81 mg total) by mouth daily.    Marland Kitchen atorvastatin (LIPITOR) 40 MG tablet Take 1 tablet (40 mg total) by mouth at bedtime. 90 tablet 3  . Cholecalciferol LIQD Take 4,000 Units by mouth daily. Uses one drop on the tongue each morning    . Folic Acid POWD Take 1 Package by mouth daily.    . Milk Thistle 250 MG CAPS Take 250 mg by mouth daily.    . nitroGLYCERIN (NITROSTAT) 0.4 MG SL tablet Place 1 tablet (0.4 mg total) under the tongue every 5 (five) minutes x 3 doses as needed for chest pain. 25 tablet 12  . NON FORMULARY Take 1 tablet by mouth daily. RAINBOW LIGHT A MAGNESIUM / CALCIUM CHEWABLE TABLET    . prasugrel (EFFIENT) 10 MG TABS tablet Take 1 tablet (10 mg total) by mouth daily. 90 tablet 3   No current facility-administered medications for this visit.    Allergies:   Review of patient's allergies indicates no known allergies.    Social History:  The patient  reports that she has never smoked. She has never used smokeless tobacco. She reports that she  drinks about 1.0 oz of alcohol per week. She reports that she does not use illicit drugs.   Family History:  The patient's family history includes COPD in her mother; Heart attack in her maternal grandmother; Heart disease in her father and mother; Osteoporosis in her mother.    ROS:  Please see the history of present illness.   Otherwise, review of systems are positive for none.   All other systems are reviewed and negative.    PHYSICAL EXAM: VS:  BP 116/70 mmHg  Pulse 62  Ht 5\' 6"  (1.676 m)  Wt 131 lb (59.421 kg)  BMI 21.15 kg/m2  LMP 07/09/2013 , BMI Body mass index is 21.15 kg/(m^2). GEN: Well nourished, well developed, in no acute distress HEENT: normal Neck: no JVD, carotid bruits, or masses Cardiac: RRR; no murmurs, rubs, or gallops,no edema  Respiratory:  clear to auscultation bilaterally, normal work of breathing GI: soft, nontender, nondistended, + BS MS: no deformity or atrophy Skin: warm and dry, no rash Neuro:  Strength and sensation are intact Psych: euthymic mood, full affect   EKG:  EKG is not ordered today.    Recent Labs: 12/07/2013: TSH 1.043    Lipid Panel    Component Value Date/Time   CHOL 161 05/21/2013 1402  TRIG 71 05/21/2013 1402   HDL 50 05/21/2013 1402   CHOLHDL 3.2 05/21/2013 1402   VLDL 14 05/21/2013 1402   LDLCALC 97 05/21/2013 1402   LDLDIRECT 202.3 03/27/2013 1412      Wt Readings from Last 3 Encounters:  07/03/14 131 lb (59.421 kg)  06/03/14 130 lb (58.968 kg)  12/07/13 123 lb (55.792 kg)      Other studies Reviewed: Additional studies/ records that were reviewed today include: None.    ASSESSMENT AND PLAN:  Chronic diastolic heart failure: Resolved  Atherosclerosis of native coronary artery of native heart without angina pectoris: LAD DES for spontaneous coronary dissection in 2014, October. Asymptomatic since index event  Hyperlipidemia - no recent data. Plan Lipid Profile, Hepatic function panel     Current  medicines are reviewed at length with the patient today.  The patient does not have concerns regarding medicines.  The following changes have been made:  We will discontinue Effient in September prior to the office visit on October  Labs/ tests ordered today include:   Orders Placed This Encounter  Procedures  . Lipid Profile  . Hepatic function panel     Disposition:   FU with Linard Millers in 6 months   Signed, Sinclair Grooms, MD  07/03/2014 12:31 PM    [address], [place], [place]  [address] Phone: [phone]; Fax: [phone]

## 2014-07-03 NOTE — Patient Instructions (Signed)
Your physician recommends that you continue on your current medications as directed. Please refer to the Current Medication list given to you today. Early to [facility] September please stop Effient.   Your physician recommends that you have FASTING lipid profile: Today; lipid/lft  Your physician wants you to follow-up in: 6 months with Dr. Tamala Julian. You will receive a reminder letter in the mail two months in advance. If you don't receive a letter, please call our office to schedule the follow-up appointment.

## 2014-07-04 ENCOUNTER — Telehealth: Payer: Self-pay | Admitting: Gynecology

## 2014-07-04 ENCOUNTER — Telehealth: Payer: Self-pay | Admitting: Certified Nurse Midwife

## 2014-07-04 NOTE — Telephone Encounter (Signed)
Patient started bleeding and cramping. Patient is concerned because "I thought I was past menopause" Last seen 06/03/14.

## 2014-07-04 NOTE — Telephone Encounter (Signed)
Agree with information given. I also had told her this

## 2014-07-04 NOTE — Telephone Encounter (Signed)
Spoke with patient. Patient was seen for annual on 06/03/2014 with Regina Eck CNM. "I was told that I should not have any more bleeding and I started bleeding and cramping last night." LMP 07/09/2013. Cramping is relieved with ibuprofen. Patient is wearing a "pull up" that she has changed once since last night. Advised patient until she has not had a cycle for 12 months is not concerned completely menopausal. Advised can have bleeding before that time. "I am concerned because I was told I was not supposed to have any more bleeding at all." Advised patient will speak with Regina Eck CNM and return call with further recommendations. Patient is agreeable.

## 2014-07-04 NOTE — Telephone Encounter (Signed)
On Call Note 12:04 AM :  Started bleeding like a period tonight after almost one year without menses.  Not heavy or painful excepting some cramping.  Bluejacket elevated in the office 11/2013. Differential reviewed with recommendation to call office in AM and arrange an appointment for evaluation.  Ibuprofen tonight for the cramping.

## 2014-07-05 NOTE — Telephone Encounter (Signed)
Spoke with patient. Advised patient that I have spoken with Regina Eck CNM and this is not uncommon before not having a cycle for 12 consecutive months. Advised to monitor bleeding and if bleeding worsens or lasts longer than 7 days will need to be seen for further evaluation. "Now that I had this should I use a back up method? I am worried now." Advised patient to continue with BUM at this time until has had labs rechecked. Patient is agreeable. "Do I have to wait 12 months from now to be completely in menopause." Advised has to be 12 months without a cycle to be completely into menopause. Patient is ageeable.  Routing to provider for final review. Patient agreeable to disposition. Will close encounter

## 2014-07-10 ENCOUNTER — Telehealth: Payer: Self-pay

## 2014-07-10 DIAGNOSIS — E785 Hyperlipidemia, unspecified: Secondary | ICD-10-CM

## 2014-07-10 NOTE — Telephone Encounter (Signed)
-----   Message from Belva Crome, MD sent at 07/03/2014  6:43 PM EDT ----- Labs are at target. Liver is stable.

## 2014-07-10 NOTE — Telephone Encounter (Signed)
Pt aware of lab results.Labs are at target. Liver is stable. Pt verbalized understanding.

## 2014-07-12 ENCOUNTER — Encounter (HOSPITAL_COMMUNITY): Payer: Self-pay

## 2014-07-12 ENCOUNTER — Emergency Department (HOSPITAL_COMMUNITY)
Admission: EM | Admit: 2014-07-12 | Discharge: 2014-07-12 | Disposition: A | Discharge status: Home / Self Care | Payer: BLUE CROSS/BLUE SHIELD | Attending: Emergency Medicine | Admitting: Emergency Medicine

## 2014-07-12 DIAGNOSIS — Z8619 Personal history of other infectious and parasitic diseases: Secondary | ICD-10-CM | POA: Insufficient documentation

## 2014-07-12 DIAGNOSIS — R1030 Lower abdominal pain, unspecified: Secondary | ICD-10-CM | POA: Diagnosis present

## 2014-07-12 DIAGNOSIS — I252 Old myocardial infarction: Secondary | ICD-10-CM | POA: Insufficient documentation

## 2014-07-12 DIAGNOSIS — I509 Heart failure, unspecified: Secondary | ICD-10-CM | POA: Diagnosis not present

## 2014-07-12 DIAGNOSIS — Z9889 Other specified postprocedural states: Secondary | ICD-10-CM | POA: Diagnosis not present

## 2014-07-12 DIAGNOSIS — I2510 Atherosclerotic heart disease of native coronary artery without angina pectoris: Secondary | ICD-10-CM | POA: Insufficient documentation

## 2014-07-12 DIAGNOSIS — Z7982 Long term (current) use of aspirin: Secondary | ICD-10-CM | POA: Diagnosis not present

## 2014-07-12 DIAGNOSIS — Z79899 Other long term (current) drug therapy: Secondary | ICD-10-CM | POA: Insufficient documentation

## 2014-07-12 DIAGNOSIS — B9689 Other specified bacterial agents as the cause of diseases classified elsewhere: Secondary | ICD-10-CM

## 2014-07-12 DIAGNOSIS — N760 Acute vaginitis: Secondary | ICD-10-CM | POA: Insufficient documentation

## 2014-07-12 LAB — COMPREHENSIVE METABOLIC PANEL
ALK PHOS: 66 U/L (ref 39–117)
ALT: 18 U/L (ref 0–35)
ANION GAP: 10 (ref 5–15)
AST: 23 U/L (ref 0–37)
Albumin: 4.3 g/dL (ref 3.5–5.2)
BUN: 6 mg/dL (ref 6–23)
CALCIUM: 9 mg/dL (ref 8.4–10.5)
CO2: 25 mmol/L (ref 19–32)
Chloride: 103 mmol/L (ref 96–112)
Creatinine, Ser: 0.91 mg/dL (ref 0.50–1.10)
GFR, EST AFRICAN AMERICAN: 83 mL/min — AB (ref >90)
GFR, EST NON AFRICAN AMERICAN: 72 mL/min — AB (ref >90)
Glucose, Bld: 101 mg/dL — ABNORMAL HIGH (ref 70–99)
POTASSIUM: 4.2 mmol/L (ref 3.5–5.1)
SODIUM: 138 mmol/L (ref 135–145)
TOTAL PROTEIN: 6.5 g/dL (ref 6.0–8.3)
Total Bilirubin: 1.7 mg/dL — ABNORMAL HIGH (ref 0.3–1.2)

## 2014-07-12 LAB — WET PREP, GENITAL
TRICH WET PREP: NONE SEEN
YEAST WET PREP: NONE SEEN

## 2014-07-12 LAB — URINALYSIS, ROUTINE W REFLEX MICROSCOPIC
Bilirubin Urine: NEGATIVE
Glucose, UA: NEGATIVE mg/dL
Hgb urine dipstick: NEGATIVE
KETONES UR: NEGATIVE mg/dL
LEUKOCYTES UA: NEGATIVE
NITRITE: POSITIVE — AB
PH: 7 (ref 5.0–8.0)
Protein, ur: NEGATIVE mg/dL
Specific Gravity, Urine: 1.007 (ref 1.005–1.030)
Urobilinogen, UA: 1 mg/dL (ref 0.0–1.0)

## 2014-07-12 LAB — URINE MICROSCOPIC-ADD ON

## 2014-07-12 LAB — CBC WITH DIFFERENTIAL/PLATELET
BASOS ABS: 0 10*3/uL (ref 0.0–0.1)
Basophils Relative: 0 % (ref 0–1)
EOS ABS: 0.1 10*3/uL (ref 0.0–0.7)
Eosinophils Relative: 1 % (ref 0–5)
HCT: 38.8 % (ref 36.0–46.0)
HEMOGLOBIN: 13.2 g/dL (ref 12.0–15.0)
Lymphocytes Relative: 16 % (ref 12–46)
Lymphs Abs: 1.3 10*3/uL (ref 0.7–4.0)
MCH: 30.1 pg (ref 26.0–34.0)
MCHC: 34 g/dL (ref 30.0–36.0)
MCV: 88.4 fL (ref 78.0–100.0)
Monocytes Absolute: 0.5 10*3/uL (ref 0.1–1.0)
Monocytes Relative: 6 % (ref 3–12)
NEUTROS ABS: 6.1 10*3/uL (ref 1.7–7.7)
NEUTROS PCT: 77 % (ref 43–77)
Platelets: 199 10*3/uL (ref 150–400)
RBC: 4.39 MIL/uL (ref 3.87–5.11)
RDW: 13.1 % (ref 11.5–15.5)
WBC: 8 10*3/uL (ref 4.0–10.5)

## 2014-07-12 LAB — GC/CHLAMYDIA PROBE AMP (~~LOC~~) NOT AT ARMC
Chlamydia: NEGATIVE
NEISSERIA GONORRHEA: NEGATIVE

## 2014-07-12 LAB — LIPASE, BLOOD: Lipase: 27 U/L (ref 11–59)

## 2014-07-12 LAB — I-STAT BETA HCG BLOOD, ED (MC, WL, AP ONLY)

## 2014-07-12 MED ORDER — METRONIDAZOLE 500 MG PO TABS
500.0000 mg | ORAL_TABLET | Freq: Once | ORAL | Status: AC
  Administered 2014-07-12: 500 mg via ORAL
  Filled 2014-07-12: qty 1

## 2014-07-12 MED ORDER — HYDROCODONE-ACETAMINOPHEN 5-325 MG PO TABS: ORAL_TABLET | ORAL | Status: DC

## 2014-07-12 MED ORDER — ACETAMINOPHEN 325 MG PO TABS
975.0000 mg | ORAL_TABLET | Freq: Once | ORAL | Status: AC
  Administered 2014-07-12: 975 mg via ORAL
  Filled 2014-07-12: qty 3

## 2014-07-12 MED ORDER — METRONIDAZOLE 500 MG PO TABS: 500.0000 mg | ORAL_TABLET | Freq: Two times a day (BID) | ORAL | Status: DC

## 2014-07-12 NOTE — ED Notes (Signed)
Pt ambulated to restroom. 

## 2014-07-12 NOTE — Discharge Instructions (Signed)
For pain control please take ibuprofen (also known as Motrin or Advil) 800mg  (this is normally 4 over the counter pills) 3 times a day  for 5 days. Take with food to minimize stomach irritation.  Take vicodin for breakthrough pain, do not drink alcohol, drive, care for children or do other critical tasks while taking vicodin.  Return to the emergency room for severely worsening abdominal pain, abdominal pain that localizes to a particular area (especially the right lower part of the belly), pain that persists past 8-10 hours, blood in stool or vomit, severe weakness, fainting, or fever.    Abdominal Pain, Women Abdominal (stomach, pelvic, or belly) pain can be caused by many things. It is important to tell your doctor:  The location of the pain.  Does it come and go or is it present all the time?  Are there things that start the pain (eating certain foods, exercise)?  Are there other symptoms associated with the pain (fever, nausea, vomiting, diarrhea)? All of this is helpful to know when trying to find the cause of the pain. CAUSES   Stomach: virus or bacteria infection, or ulcer.  Intestine: appendicitis (inflamed appendix), regional ileitis (Crohn's disease), ulcerative colitis (inflamed colon), irritable bowel syndrome, diverticulitis (inflamed diverticulum of the colon), or cancer of the stomach or intestine.  Gallbladder disease or stones in the gallbladder.  Kidney disease, kidney stones, or infection.  Pancreas infection or cancer.  Fibromyalgia (pain disorder).  Diseases of the female organs:  Uterus: fibroid (non-cancerous) tumors or infection.  Fallopian tubes: infection or tubal pregnancy.  Ovary: cysts or tumors.  Pelvic adhesions (scar tissue).  Endometriosis (uterus lining tissue growing in the pelvis and on the pelvic organs).  Pelvic congestion syndrome (female organs filling up with blood just before the menstrual period).  Pain with the menstrual  period.  Pain with ovulation (producing an egg).  Pain with an IUD (intrauterine device, birth control) in the uterus.  Cancer of the female organs.  Functional pain (pain not caused by a disease, may improve without treatment).  Psychological pain.  Depression. DIAGNOSIS  Your doctor will decide the seriousness of your pain by doing an examination.  Blood tests.  X-rays.  Ultrasound.  CT scan (computed tomography, special type of X-ray).  MRI (magnetic resonance imaging).  Cultures, for infection.  Barium enema (dye inserted in the large intestine, to better view it with X-rays).  Colonoscopy (looking in intestine with a lighted tube).  Laparoscopy (minor surgery, looking in abdomen with a lighted tube).  Major abdominal exploratory surgery (looking in abdomen with a large incision). TREATMENT  The treatment will depend on the cause of the pain.   Many cases can be observed and treated at home.  Over-the-counter medicines recommended by your caregiver.  Prescription medicine.  Antibiotics, for infection.  Birth control pills, for painful periods or for ovulation pain.  Hormone treatment, for endometriosis.  Nerve blocking injections.  Physical therapy.  Antidepressants.  Counseling with a psychologist or psychiatrist.  Minor or major surgery. HOME CARE INSTRUCTIONS   Do not take laxatives, unless directed by your caregiver.  Take over-the-counter pain medicine only if ordered by your caregiver. Do not take aspirin because it can cause an upset stomach or bleeding.  Try a clear liquid diet (broth or water) as ordered by your caregiver. Slowly move to a bland diet, as tolerated, if the pain is related to the stomach or intestine.  Have a thermometer and take your temperature several times a  day, and record it.  Bed rest and sleep, if it helps the pain.  Avoid sexual intercourse, if it causes pain.  Avoid stressful situations.  Keep your  follow-up appointments and tests, as your caregiver orders.  If the pain does not go away with medicine or surgery, you may try:  Acupuncture.  Relaxation exercises (yoga, meditation).  Group therapy.  Counseling. SEEK MEDICAL CARE IF:   You notice certain foods cause stomach pain.  Your home care treatment is not helping your pain.  You need stronger pain medicine.  You want your IUD removed.  You feel faint or lightheaded.  You develop nausea and vomiting.  You develop a rash.  You are having side effects or an allergy to your medicine. SEEK IMMEDIATE MEDICAL CARE IF:   Your pain does not go away or gets worse.  You have a fever.  Your pain is felt only in portions of the abdomen. The right side could possibly be appendicitis. The left lower portion of the abdomen could be colitis or diverticulitis.  You are passing blood in your stools (bright red or black tarry stools, with or without vomiting).  You have blood in your urine.  You develop chills, with or without a fever.  You pass out. MAKE SURE YOU:   Understand these instructions.  Will watch your condition.  Will get help right away if you are not doing well or get worse. Document Released: 01/10/2007 Document Revised: 07/30/2013 Document Reviewed: 01/30/2009 [facility] Patient Information 2015 [place], [place]. This information is not intended to replace advice given to you by your health care provider. Make sure you discuss any questions you have with your health care provider.  Bacterial Vaginosis Bacterial vaginosis is a vaginal infection that occurs when the normal balance of bacteria in the vagina is disrupted. It results from an overgrowth of certain bacteria. This is the most common vaginal infection in women of childbearing age. Treatment is important to prevent complications, especially in pregnant women, as it can cause a premature delivery. CAUSES  Bacterial vaginosis is caused by an increase in  harmful bacteria that are normally present in smaller amounts in the vagina. Several different kinds of bacteria can cause bacterial vaginosis. However, the reason that the condition develops is not fully understood. RISK FACTORS Certain activities or behaviors can put you at an increased risk of developing bacterial vaginosis, including:  Having a new sex partner or multiple sex partners.  Douching.  Using an intrauterine device (IUD) for contraception. Women do not get bacterial vaginosis from toilet seats, bedding, swimming pools, or contact with objects around them. SIGNS AND SYMPTOMS  Some women with bacterial vaginosis have no signs or symptoms. Common symptoms include:  Grey vaginal discharge.  A fishlike odor with discharge, especially after sexual intercourse.  Itching or burning of the vagina and vulva.  Burning or pain with urination. DIAGNOSIS  Your health care provider will take a medical history and examine the vagina for signs of bacterial vaginosis. A sample of vaginal fluid may be taken. Your health care provider will look at this sample under a microscope to check for bacteria and abnormal cells. A vaginal pH test may also be done.  TREATMENT  Bacterial vaginosis may be treated with antibiotic medicines. These may be given in the form of a pill or a vaginal cream. A second round of antibiotics may be prescribed if the condition comes back after treatment.  HOME CARE INSTRUCTIONS   Only take over-the-counter or prescription  medicines as directed by your health care provider.  If antibiotic medicine was prescribed, take it as directed. Make sure you finish it even if you start to feel better.  Do not have sex until treatment is completed.  Tell all sexual partners that you have a vaginal infection. They should see their health care provider and be treated if they have problems, such as a mild rash or itching.  Practice safe sex by using condoms and only having one  sex partner. SEEK MEDICAL CARE IF:   Your symptoms are not improving after 3 days of treatment.  You have increased discharge or pain.  You have a fever. MAKE SURE YOU:   Understand these instructions.  Will watch your condition.  Will get help right away if you are not doing well or get worse. FOR MORE INFORMATION  Centers for Disease Control and Prevention, Division of STD Prevention: AppraiserFraud[employer]fi American Sexual Health Association (ASHA): www.ashastd.org  Document Released: 03/15/2005 Document Revised: 01/03/2013 Document Reviewed: 10/25/2012 [facility] Patient Information 2015 [place], [place]. This information is not intended to replace advice given to you by your health care provider. Make sure you discuss any questions you have with your health care provider.

## 2014-07-12 NOTE — ED Notes (Signed)
Pt states that on Thursday around 0700 she woke up to lower abdominal pain that she believed was a UTI, pt describes pain as burning and "grinding" pt attempted to treat symptoms at home with teas, cranberry juice, and water,. Pt states that urine "doensn't look normal" and states that it is dark in color.

## 2014-07-12 NOTE — ED Provider Notes (Signed)
CSN: [id number]     Arrival date & time 07/12/14  0610 History   None    Chief Complaint  Patient presents with  . Abdominal Pain     (Consider location/radiation/quality/duration/timing/severity/associated sxs/prior Treatment) HPI   Karina Richards is a 49 y.o. female with past medical history significant for CAD, CHF complaining of lower abdominal discomfort, dysuria, hematuria, concentrated urine and urinary frequency worsening over the course of 24 hours. Patient has been drinking cranberry juice, she's tried Azo at home with little relief. She also reports a thick vaginal discharge starting one week ago. Patient is perimenopausal, she is not demonstrated in a year and she had a period that lasted 3 days 1 week ago. Her OB/GYN is aware: 15 indicated via phone but she has not checked in for clinical visit. Patient denies flank pain, fever, chills, nausea, vomiting, change in bowel habits. Pain is 5 out of 10 right now, this is down from 8 out of 10 last night.  Past Medical History  Diagnosis Date  . Herpes simplex type 1 infection   . Coronary artery disease   . Myocardial infarction   . CHF (congestive heart failure)    Past Surgical History  Procedure Laterality Date  . Inguinal hernia repair Right 2008  . Left heart catheterization with coronary angiogram N/A 12/31/2012    LAD DES   Family History  Problem Relation Age of Onset  . Osteoporosis Mother   . Heart disease Mother   . COPD Mother   . Heart disease Father   . Heart attack Maternal Grandmother    History  Substance Use Topics  . Smoking status: Never Smoker   . Smokeless tobacco: Never Used  . Alcohol Use: 1.0 oz/week    2 Standard drinks or equivalent per week   OB History    Gravida Para Term Preterm AB TAB SAB Ectopic Multiple Living   3 2  2 1  1   2      Review of Systems  10 systems reviewed and found to be negative, except as noted in the HPI.   Allergies  Review of patient's allergies  indicates no known allergies.  Home Medications   Prior to Admission medications   Medication Sig Start Date End Date Taking? Authorizing Provider  aspirin EC 81 MG EC tablet Take 1 tablet (81 mg total) by mouth daily. 01/04/13  Yes Belva Crome, MD  atorvastatin (LIPITOR) 40 MG tablet Take 1 tablet (40 mg total) by mouth at bedtime. 11/27/13  Yes Liliane Shi, PA-C  Cholecalciferol LIQD Take 4,000 Units by mouth daily. Uses one drop on the tongue each morning   Yes Historical Provider, MD  Folic Acid POWD Take 1 Package by mouth daily.   Yes Historical Provider, MD  Milk Thistle 250 MG CAPS Take 250 mg by mouth daily.   Yes Historical Provider, MD  nitroGLYCERIN (NITROSTAT) 0.4 MG SL tablet Place 1 tablet (0.4 mg total) under the tongue every 5 (five) minutes x 3 doses as needed for chest pain. 01/04/13  Yes Belva Crome, MD  NON FORMULARY Take 1 tablet by mouth daily. RAINBOW LIGHT A MAGNESIUM / CALCIUM CHEWABLE TABLET   Yes Historical Provider, MD  Phenazopyridine HCl 97.5 MG TABS Take 1 tablet by mouth daily as needed (pain).   Yes Historical Provider, MD  prasugrel (EFFIENT) 10 MG TABS tablet Take 1 tablet (10 mg total) by mouth daily. 11/27/13  Yes Liliane Shi, PA-C  HYDROcodone-acetaminophen (NORCO/VICODIN)  5-325 MG per tablet Take 1-2 tablets by mouth every 6 hours as needed for pain and/or cough. 07/12/14   Ozetta Flatley, PA-C  metroNIDAZOLE (FLAGYL) 500 MG tablet Take 1 tablet (500 mg total) by mouth 2 (two) times daily. One tab PO bid x 14 days 07/12/14   Elmyra Ricks Maribel Luis, PA-C   BP 119/78 mmHg  Pulse 62  Temp(Src) 98.2 F (36.8 C) (Oral)  Resp 14  Ht 5\' 6"  (1.676 m)  Wt 125 lb (56.7 kg)  BMI 20.19 kg/m2  SpO2 100%  LMP 07/03/2014 Physical Exam  Constitutional: She is oriented to person, place, and time. She appears well-developed and well-nourished. No distress.  HENT:  Head: Normocephalic and atraumatic.  Mouth/Throat: Oropharynx is clear and moist.  Eyes: Conjunctivae  and EOM are normal. Pupils are equal, round, and reactive to light.  Neck: Normal range of motion.  Cardiovascular: Normal rate, regular rhythm and intact distal pulses.   Pulmonary/Chest: Effort normal and breath sounds normal. No stridor.  Abdominal: Soft. She exhibits no distension and no mass. There is tenderness. There is no rebound and no guarding.  Mild tenderness to deep palpation in the suprapubic area and left lower quadrant greater than right lower quadrant. No guarding or rebound. Rovsing, psoas and obturator are negative.  Genitourinary:  No CVA tenderness to palpation bilaterally  Pelvic exam a chaperoned by technician: No rashes or lesions, no abnormal vaginal discharge, no cervical motion or adnexal tenderness.  Musculoskeletal: Normal range of motion.  Neurological: She is alert and oriented to person, place, and time.  Skin: She is not diaphoretic.  Psychiatric: She has a normal mood and affect.  Nursing note and vitals reviewed.   ED Course  Procedures (including critical care time) Labs Review Labs Reviewed  WET PREP, GENITAL - Abnormal; Notable for the following:    Clue Cells Wet Prep HPF POC FEW (*)    WBC, Wet Prep HPF POC FEW (*)    All other components within normal limits  URINALYSIS, ROUTINE W REFLEX MICROSCOPIC - Abnormal; Notable for the following:    Color, Urine ORANGE (*)    Nitrite POSITIVE (*)    All other components within normal limits  COMPREHENSIVE METABOLIC PANEL - Abnormal; Notable for the following:    Glucose, Bld 101 (*)    Total Bilirubin 1.7 (*)    GFR calc non Af Amer 72 (*)    GFR calc Af Amer 83 (*)    All other components within normal limits  URINE CULTURE  CBC WITH DIFFERENTIAL/PLATELET  LIPASE, BLOOD  URINE MICROSCOPIC-ADD ON  I-STAT BETA HCG BLOOD, ED ([facility], [facility], [facility] ONLY)  GC/CHLAMYDIA PROBE AMP ([facility])    Imaging Review No results found.   EKG Interpretation None      MDM   Final diagnoses:  Lower  abdominal pain  Bacterial vaginosis    Filed Vitals:   07/12/14 0730 07/12/14 0753 07/12/14 0800 07/12/14 0900  BP: 134/89 126/81 112/77 119/78  Pulse: 64 62 68 62  Temp:      TempSrc:      Resp: 13 10 17 14   Height:      Weight:      SpO2: 100% 100% 100% 100%    Medications  metroNIDAZOLE (FLAGYL) tablet 500 mg (not administered)  acetaminophen (TYLENOL) tablet 975 mg (975 mg Oral Given 07/12/14 0706)    Dariella Gillihan is a pleasant 49 y.o. female presenting with UTI type symptoms starting one day ago. Patient is afebrile  and well-appearing. She also reports a lower abdominal discomfort even when she is not urinating. Abdominal exam is nonsurgical. She has vaginal discharge starting one week ago. Will check basic blood work, UA and perform pelvic exam.  Pelvic exam with no significant abnormalities, there are a few clue cells, she reports heavy vaginal discharge and will treat her for BV. Urinalysis with positive nitrite but microscopic is negative. Patient has been taking Azo which will cause a false positive nitrite. I do not think this is a urinary tract infection. Serial abdominal exams remained nonsurgical. I've advised patient that she follow with her primary care physician OB/GYN. She has no focal tenderness to palpation over the right lower quadrant, Rovsing, psoas obturator are negative. She has no signs of a small bowel obstruction, she systemically well appearing. With had an extensive discussion of return precautions. Patient verbalizes her understanding. Patient will be sent home with pain control.   Evaluation does not show pathology that would require ongoing emergent intervention or inpatient treatment. Pt is hemodynamically stable and mentating appropriately. Discussed findings and plan with patient/guardian, who agrees with care plan. All questions answered. Return precautions discussed and outpatient follow up given.   New Prescriptions   HYDROCODONE-ACETAMINOPHEN  (NORCO/VICODIN) 5-325 MG PER TABLET    Take 1-2 tablets by mouth every 6 hours as needed for pain and/or cough.   METRONIDAZOLE (FLAGYL) 500 MG TABLET    Take 1 tablet (500 mg total) by mouth 2 (two) times daily. One tab PO bid x 14 days         Monico Blitz, PA-C 07/12/14 [place], DO 07/13/14 [phone]

## 2014-07-13 LAB — URINE CULTURE
Colony Count: NO GROWTH
Culture: NO GROWTH

## 2014-08-02 ENCOUNTER — Telehealth: Payer: Self-pay | Admitting: Certified Nurse Midwife

## 2014-08-02 DIAGNOSIS — N950 Postmenopausal bleeding: Secondary | ICD-10-CM

## 2014-08-02 DIAGNOSIS — R1032 Left lower quadrant pain: Secondary | ICD-10-CM

## 2014-08-02 NOTE — Addendum Note (Signed)
Addended by: Jasmine Awe on: 08/02/2014 10:28 AM   Modules accepted: Orders

## 2014-08-02 NOTE — Telephone Encounter (Signed)
Spoke with patient. Patient states that she has been having left lower quadrant pain for 3-4 weeks. Pain is intermittent but has been coming more frequently over the last two days. This past Saturday and Sunday 4/30-5/1 patient had "heavy" bleeding. Was having to wear over night pad daily. Bleeding has now stopped but has had spotting intermittently throughout the week. Denies any current bleeding. States LLQ pain is currently a 3/10 after taking ibuprofen. Patient was seen at the ER on 4/15 for pain and was advised to seek MD care if pain persists. Patient did not have any imaging done at the hospital. Advised will speak with Regina Eck CNM and return call with further recommendations. Patient is agreeable and verbalizes understanding.

## 2014-08-02 NOTE — Telephone Encounter (Signed)
Pt has been having heavy bleeding and some left sided pain for 2-3 weeks.

## 2014-08-02 NOTE — Telephone Encounter (Addendum)
Spoke with patient. Denies any nausea, vomiting, diarrhea or fever. LMP 07/09/13 prior to bleeding this Saturday-Sunday 4/30-5/1. Advised patient I spoke with Dr.Silva here in the office regarding her symptoms as Regina Eck CNM was seeing a patient. Advised per review with Dr.Silva it is recommended that we get her scheduled for an ultrasound here in the office. Patient is agreeable. Appointment for ultrasound scheduled for 5/12 at 10am with 10:30am consult with Dr.Silva. Patient is agreeable to date and time. Advised patient if symptoms worsen or change over the weekend will need to be seen at [facility]. Patient is agreeable and verbalizes understanding. Order for PUS placed for precert. Possible EMB order placed as well just in case this is needed at this appointment.  Cc: Regina Eck CNM   Routing to provider for final review. Patient agreeable to disposition. Patient aware provider will review message and nurse will return call with any additional instructions or change of disposition. Will close encounter.

## 2014-08-05 ENCOUNTER — Telehealth: Payer: Self-pay | Admitting: Obstetrics and Gynecology

## 2014-08-05 NOTE — Telephone Encounter (Signed)
Spoke with patient. Advised of benefit quote received for PUS. Patient agreeable.

## 2014-08-08 ENCOUNTER — Encounter: Payer: Self-pay | Admitting: Obstetrics and Gynecology

## 2014-08-08 ENCOUNTER — Ambulatory Visit (INDEPENDENT_AMBULATORY_CARE_PROVIDER_SITE_OTHER): Payer: BLUE CROSS/BLUE SHIELD | Admitting: Obstetrics and Gynecology

## 2014-08-08 ENCOUNTER — Ambulatory Visit (INDEPENDENT_AMBULATORY_CARE_PROVIDER_SITE_OTHER): Payer: BLUE CROSS/BLUE SHIELD

## 2014-08-08 VITALS — BP 136/88 | HR 76 | Resp 16 | Ht 66.0 in | Wt 127.0 lb

## 2014-08-08 DIAGNOSIS — N950 Postmenopausal bleeding: Secondary | ICD-10-CM

## 2014-08-08 DIAGNOSIS — R1032 Left lower quadrant pain: Secondary | ICD-10-CM

## 2014-08-08 DIAGNOSIS — M25559 Pain in unspecified hip: Secondary | ICD-10-CM

## 2014-08-08 LAB — CBC WITH DIFFERENTIAL/PLATELET
HEMATOCRIT: 44.4 % (ref 36.0–46.0)
Hemoglobin: 15 g/dL (ref 12.0–15.0)
LYMPHS PCT: 35 % (ref 12–46)
Lymphs Abs: 1.7 10*3/uL (ref 0.7–4.0)
MCH: 29.8 pg (ref 26.0–34.0)
MCHC: 33.7 g/dL (ref 30.0–36.0)
MCV: 88.2 fL (ref 78.0–100.0)
MONO ABS: 0.3 10*3/uL (ref 0.1–1.0)
MONOS PCT: 6 % (ref 3–12)
NEUTROS ABS: 2.9 10*3/uL (ref 1.7–7.7)
Neutrophils Relative %: 59 % (ref 43–77)
Platelets: 211 10*3/uL (ref 150–400)
RBC: 5.03 MIL/uL (ref 3.87–5.11)
RDW: 12.3 % (ref 11.5–15.5)
WBC: 4.9 10*3/uL (ref 4.0–10.5)

## 2014-08-08 NOTE — Progress Notes (Signed)
GYNECOLOGY  VISIT   HPI: 49 y.o.   Married  Caucasian  female   [id number] with Patient's last menstrual period was 07/03/2014.   here for  LLQ pain and bleeding.   Routine annual exam in March.  Low AMH.    April 8 had heavy menses and pain.  (LMP was one week away from being one year from last prior menses.) Pain resolved one week later, but then developed pain again and went to the ER and ruled out UTI.  Received Rx for Flagyl for treatment of  bacterial vaginosis, but patient did not take.   April 30 started having heavy bleeding again and bleeding at least spotting up to today.  Now brown blood.   Grinding wearing down pain.  Pain is just the left and right of umbilical and about 4 cm below on each side.  No fever, diarrhea, nausea or vomiting.  Denies history of diverticulosis or diverticulitis.  History of ovarian cyst 2 - 3 year ago.  No surgery   GYNECOLOGIC HISTORY: Patient's last menstrual period was 07/03/2014.  Sexually active.  No new partner.  Contraception:  Condoms Menopausal hormone therapy:  None.         OB History    Gravida Para Term Preterm AB TAB SAB Ectopic Multiple Living   3 2  2 1  1   2          Patient Active Problem List   Diagnosis Date Noted  . Ischemic cardiomyopathy 11/27/2013  . History of oral aphthous ulcers 03/19/2013  . Coronary atherosclerosis of native coronary artery 03/19/2013  . Hyperlipidemia 01/04/2013    Class: Chronic  . Chronic diastolic heart failure 46/65/9935    Class: Acute  . Old MI (myocardial infarction) 12/31/2012    Class: Acute    Past Medical History  Diagnosis Date  . Herpes simplex type 1 infection   . Coronary artery disease   . Myocardial infarction   . CHF (congestive heart failure)     Past Surgical History  Procedure Laterality Date  . Inguinal hernia repair Right 2008  . Left heart catheterization with coronary angiogram N/A 12/31/2012    LAD DES    Current Outpatient Prescriptions    Medication Sig Dispense Refill  . aspirin EC 81 MG EC tablet Take 1 tablet (81 mg total) by mouth daily.    Marland Kitchen atorvastatin (LIPITOR) 40 MG tablet Take 1 tablet (40 mg total) by mouth at bedtime. 90 tablet 3  . Cholecalciferol LIQD Take 4,000 Units by mouth daily. Uses one drop on the tongue each morning    . Folic Acid POWD Take 1 Package by mouth daily.    . Milk Thistle 250 MG CAPS Take 250 mg by mouth daily.    . prasugrel (EFFIENT) 10 MG TABS tablet Take 1 tablet (10 mg total) by mouth daily. 90 tablet 3  . nitroGLYCERIN (NITROSTAT) 0.4 MG SL tablet Place 1 tablet (0.4 mg total) under the tongue every 5 (five) minutes x 3 doses as needed for chest pain. (Patient not taking: Reported on 08/08/2014) 25 tablet 12  . NON FORMULARY Take 1 tablet by mouth daily. RAINBOW LIGHT A MAGNESIUM / CALCIUM CHEWABLE TABLET     No current facility-administered medications for this visit.     ALLERGIES: Review of patient's allergies indicates no known allergies.  Family History  Problem Relation Age of Onset  . Osteoporosis Mother   . Heart disease Mother   . COPD Mother   .  Heart disease Father   . Heart attack Maternal Grandmother     History   Social History  . Marital Status: Married    Spouse Name: N/A  . Number of Children: N/A  . Years of Education: N/A   Occupational History  . Not on file.   Social History Main Topics  . Smoking status: Never Smoker   . Smokeless tobacco: Never Used  . Alcohol Use: 1.0 oz/week    2 Standard drinks or equivalent per week  . Drug Use: No  . Sexual Activity:    Partners: Male    Birth Control/ Protection: Condom   Other Topics Concern  . Not on file   Social History Narrative    ROS:  Pertinent items are noted in HPI.  PHYSICAL EXAMINATION:    BP 136/88 mmHg  Pulse 76  Resp 16  Ht 5\' 6"  (1.676 m)  Wt 127 lb (57.607 kg)  BMI 20.51 kg/m2  LMP 07/03/2014     General appearance: alert, cooperative and appears stated age.  Looks  well.  Lungs: clear to auscultation bilaterally Heart: regular rate and rhythm Abdomen: soft, non-tender; no masses,  no organomegaly No abnormal inguinal nodes palpated  Pelvic: External genitalia:  no lesions              Urethra:  normal appearing urethra with no masses, tenderness or lesions              Bartholins and Skenes: normal                 Vagina: normal appearing vagina with normal color and discharge, no lesions              Cervix: normal appearance                   Bimanual Exam:  Uterus:  uterus is normal size, shape, consistency and nontender                                      Adnexa: normal adnexa in size, nontender and no masses        Rectovaginal exam - confirms above.  No masses or tenderness.                           Pelvic ultrasound -   Uterus with 1 small fibroids 14 mm, intramural.  Two small cystic spaces of myometrium 5 and 6 mm.  EMS 2.73 mm.  Right ovary with small follicle.  Left ovary with mixed mass versus fluid collection -   41 x 26 x 24 mm  Procedure - endometrial biopsy Consent performed. Speculum place in vagina.  Sterile prep of cervix with betadine.  Tenaculum to anterior cervical lip.  Paracervical block with 10 cc 1% lidocaine - lot number __[id number]___, expiration _____1/1/17_____ Pipelle placed to    7      cm without difficulty twice. Tissue obtained and sent to pathology. Speculum removed.  EBL 15 cc from paracervical block. No complications.  ASSESSMENT  Pelvic pain.  Abnormal uterine bleeding.  Fibroids. Left adnexal mass.  Chronic abscess? No acute abdomen.   PLAN  Discussion of ultrasound findings of adnexal fluid. Follow up EMB.  Rule out endometritis. CBC with differential.  GC/CT from urine.  Will consider empiric abx therapy.  Will need follow up next week, sooner  as needed.  An After Visit Summary was printed and given to the patient.  __25____ minutes face to face time of which over 50% was spent in  counseling.

## 2014-08-08 NOTE — Patient Instructions (Signed)
Endometrial Biopsy, Care After Refer to this sheet in the next few weeks. These instructions provide you with information on caring for yourself after your procedure. Your health care provider may also give you more specific instructions. Your treatment has been planned according to current medical practices, but problems sometimes occur. Call your health care provider if you have any problems or questions after your procedure. WHAT TO EXPECT AFTER THE PROCEDURE After your procedure, it is typical to have the following:  You may have mild cramping and a small amount of vaginal bleeding for a few days after the procedure. This is normal. HOME CARE INSTRUCTIONS  Only take over-the-counter or prescription medicine as directed by your health care provider.  Do not douche, use tampons, or have sexual intercourse until your health care provider approves.  Follow your health care provider's instructions regarding any activity restrictions, such as strenuous exercise or heavy lifting. SEEK MEDICAL CARE IF:  You have heavy bleeding or bleeding longer than 2 days after the procedure.  You have bad smelling drainage from your vagina.  You have a fever and chills.  Youhave severe lower stomach (abdominal) pain. SEEK IMMEDIATE MEDICAL CARE IF:  You have severe cramps in your stomach or back.  You pass large blood clots.  Your bleeding increases.  You become weak or lightheaded, or you pass out. Document Released: 01/03/2013 Document Reviewed: 01/03/2013 Exit[employer] Patient Information 2015 [employer]. This information is not intended to replace advice given to you by your health care provider. Make sure you discuss any questions you have with your health care provider.  

## 2014-08-09 ENCOUNTER — Telehealth: Payer: Self-pay | Admitting: Emergency Medicine

## 2014-08-09 DIAGNOSIS — R102 Pelvic and perineal pain: Secondary | ICD-10-CM

## 2014-08-09 LAB — GC/CHLAMYDIA PROBE AMP, URINE
Chlamydia, Swab/Urine, PCR: NEGATIVE
GC PROBE AMP, URINE: NEGATIVE

## 2014-08-09 MED ORDER — METRONIDAZOLE 500 MG PO TABS: ORAL_TABLET | ORAL | Status: DC

## 2014-08-09 MED ORDER — LEVOFLOXACIN 500 MG PO TABS: 500.0000 mg | ORAL_TABLET | Freq: Every day | ORAL | Status: DC

## 2014-08-09 NOTE — Telephone Encounter (Signed)
-----   Message from Nunzio Cobbs, MD sent at 08/09/2014 11:31 AM EDT ----- Please see prior note regarding staring abx for this patient.  Her GC and CT are negative.   Thank you.

## 2014-08-09 NOTE — Telephone Encounter (Signed)
Called patient and message from Dr. Quincy Simmonds given.  Patient verbalized understanding and is agreeable to plan.  She has Flagyl at home, advised to hold what she has and just start new Rx from Dr. Quincy Simmonds as it is two weeks worth of treatment.  She is given etoh precautions with flagyl and verbalizes understanding.  She is given follow up Pelvic ultrasound appointment with Dr. Quincy Simmonds for 08/15/14 at 1030 and 11:00 consult with Dr. Quincy Simmonds.  Medications sent to [facility] as requested and called and confirmed with Merrilee Seashore that prescriptions have been received and no questions from pharmacist.  Patient verbalized understanding to follow up instructions from Dr. Quincy Simmonds and expressed understanding of symptoms that would necessitate going to ER.   Routing to provider for final review. Patient agreeable to disposition. Patient aware provider will review message and nurse will return call with any additional instructions or change of disposition. Will close encounter.

## 2014-08-09 NOTE — Telephone Encounter (Signed)
-----   Message from Nunzio Cobbs, MD sent at 08/09/2014  8:13 AM EDT ----- Please contact this patient regarding her blood work which showed a normal WBC and differential.  GC/CT are pending. She had a fluid collection around her left ovary yesterday that may represent a chronic abscess.  I told her I may treat with abx, and I am going to follow through with this plan. Please send the following abx to her pharmacy of choice - Levaquin 500 mg daily for 2 weeks.  Flagyl 500 mg po bid for 2 weeks. Start today.  She told me yesterday that she has Flagyl at home that she did not take from her recent ER visit.   I am just not sure if she has enough.  She will need to come in on Thursday next week for a repeat ultrasound visit and appointment with me.  Possible blood work also. I am recommending pelvic rest and no heavy exercise.  If she develops worsening pain, fever to 100.4 or higher, or nausea and vomiting, she will need to go to the Emergency Department for evaluation.

## 2014-08-15 ENCOUNTER — Ambulatory Visit (INDEPENDENT_AMBULATORY_CARE_PROVIDER_SITE_OTHER): Payer: BLUE CROSS/BLUE SHIELD

## 2014-08-15 ENCOUNTER — Encounter: Payer: Self-pay | Admitting: Obstetrics and Gynecology

## 2014-08-15 ENCOUNTER — Ambulatory Visit (INDEPENDENT_AMBULATORY_CARE_PROVIDER_SITE_OTHER): Payer: BLUE CROSS/BLUE SHIELD | Admitting: Obstetrics and Gynecology

## 2014-08-15 VITALS — BP 134/80 | HR 60 | Ht 66.0 in | Wt 129.0 lb

## 2014-08-15 DIAGNOSIS — N839 Noninflammatory disorder of ovary, fallopian tube and broad ligament, unspecified: Secondary | ICD-10-CM | POA: Diagnosis not present

## 2014-08-15 DIAGNOSIS — R102 Pelvic and perineal pain: Secondary | ICD-10-CM

## 2014-08-15 DIAGNOSIS — N838 Other noninflammatory disorders of ovary, fallopian tube and broad ligament: Secondary | ICD-10-CM

## 2014-08-15 NOTE — Progress Notes (Signed)
Subjective  49 y.o. [id number]  Married caucasian female here for pelvic ultrasound for follow up of left ovarian mass thought to be possible tubo-ovarian mass.  Had normal CBC with differential and negative GC/CT.  EMB was performed the same day and showed proliferative type endometrium and possible endometrial type polyp and no evidence of atypia or malignancy. Patient was treated for chronic abscess and placed on Levaquin 500 mg daily and Flagyl 500 mg po bid.   Feeling better than last week.  Having decreased pain.  Vaginal bleeding stopped 2 days ago.  Had started April 30.  No fevers, shakes or chills, nausea, vomiting, diarrhea.  Good appetite. Pain has really diminished. If is hurts anywhere, it is more on the right side.  Not taking pain medication.   EMB 08/08/14 - showed benign endometrial polyp.   Denies any interventional procedures done through the left groin.  Had these on the right side.  Cardiac history and takes blood thinner.  Objective  T 97.6,   BP 134/80,   Pulse 60 General - NAD.  Lungs - CTA bilaterally. Cor - S1S2 RRR. Abdomen - Good bowel sounds, soft, mild tenderness in RLQ to deep palpation, no guarding or rebound.  No LLQ pain.  Pelvic - Normal external genitalia and urethra.  Cervix and vagina normal.  Uterus - small, nontender. Adnexa - some right adnexal tenderness - mild.  No left adnexal tenderness. No masses.  Pelvic ultrasound images and report reviewed with patient.  Uterus - uterine fibroid. EMS - 4.34 mm.  Ovaries - right ovary normal.  Left ovary with complex mass versus fluid - total measurements 51 x 38 x 31 mm and involves tube.  Measurement last week was 46 x 26 x 24 mm. Free fluid - yes - mild to moderate free fluid.    Assessment  Complex left adnexal mass - increasing in size slightly. Etiology?  Symptomatically improved following course of Levaquin and Flagyl for one week.  Endometrial polyp on EMB.  Cardiac history.  On blood  thinner.   Plan  Discussed possible etiologies of left adnexal mass including TOA, bowel, ovarian cyst including cancer. CA125, CEA, CBC with differential, CMP.  Proceed with CT of abdomen and pelvis with contrast.  Patient is in agreement.  Final plan to follow.   __25_____ minutes face to face time of which over 50% was spent in counseling.   After visit summary to patient.

## 2014-08-15 NOTE — Progress Notes (Signed)
Patient is scheduled for CT Abdomen and Pelvis with contrast for 08/16/14 at 0830 at [facility] [place]. She has gone now to pick up oral contrast and instructions from radiology at [facility] hospital.

## 2014-08-16 ENCOUNTER — Ambulatory Visit (HOSPITAL_COMMUNITY)
Admission: RE | Admit: 2014-08-16 | Discharge: 2014-08-16 | Disposition: A | Discharge status: Home / Self Care | Payer: BLUE CROSS/BLUE SHIELD | Source: Ambulatory Visit | Attending: Obstetrics and Gynecology | Admitting: Obstetrics and Gynecology

## 2014-08-16 ENCOUNTER — Telehealth: Payer: Self-pay | Admitting: Obstetrics and Gynecology

## 2014-08-16 DIAGNOSIS — N838 Other noninflammatory disorders of ovary, fallopian tube and broad ligament: Secondary | ICD-10-CM

## 2014-08-16 DIAGNOSIS — N8320 Unspecified ovarian cysts: Secondary | ICD-10-CM | POA: Insufficient documentation

## 2014-08-16 DIAGNOSIS — I700 Atherosclerosis of aorta: Secondary | ICD-10-CM | POA: Insufficient documentation

## 2014-08-16 LAB — CBC WITH DIFFERENTIAL/PLATELET
BASOS PCT: 0 % (ref 0–1)
Basophils Absolute: 0 10*3/uL (ref 0.0–0.1)
EOS ABS: 0 10*3/uL (ref 0.0–0.7)
Eosinophils Relative: 0 % (ref 0–5)
HCT: 42.8 % (ref 36.0–46.0)
HEMOGLOBIN: 14.6 g/dL (ref 12.0–15.0)
LYMPHS ABS: 1.5 10*3/uL (ref 0.7–4.0)
Lymphocytes Relative: 29 % (ref 12–46)
MCH: 29.9 pg (ref 26.0–34.0)
MCHC: 34.1 g/dL (ref 30.0–36.0)
MCV: 87.5 fL (ref 78.0–100.0)
MPV: 10.6 fL (ref 8.6–12.4)
Monocytes Absolute: 0.3 10*3/uL (ref 0.1–1.0)
Monocytes Relative: 5 % (ref 3–12)
NEUTROS PCT: 66 % (ref 43–77)
Neutro Abs: 3.3 10*3/uL (ref 1.7–7.7)
PLATELETS: 200 10*3/uL (ref 150–400)
RBC: 4.89 MIL/uL (ref 3.87–5.11)
RDW: 13.1 % (ref 11.5–15.5)
WBC: 5 10*3/uL (ref 4.0–10.5)

## 2014-08-16 LAB — COMPREHENSIVE METABOLIC PANEL
ALBUMIN: 4.5 g/dL (ref 3.5–5.2)
ALT: 17 U/L (ref 0–35)
AST: 20 U/L (ref 0–37)
Alkaline Phosphatase: 49 U/L (ref 39–117)
BUN: 18 mg/dL (ref 6–23)
CO2: 27 mEq/L (ref 19–32)
Calcium: 9.6 mg/dL (ref 8.4–10.5)
Chloride: 102 mEq/L (ref 96–112)
Creat: 0.93 mg/dL (ref 0.50–1.10)
GLUCOSE: 79 mg/dL (ref 70–99)
POTASSIUM: 4.1 meq/L (ref 3.5–5.3)
SODIUM: 135 meq/L (ref 135–145)
TOTAL PROTEIN: 6.7 g/dL (ref 6.0–8.3)
Total Bilirubin: 1.4 mg/dL — ABNORMAL HIGH (ref 0.2–1.2)

## 2014-08-16 LAB — CA 125: CA 125: 24 U/mL (ref <35)

## 2014-08-16 LAB — CEA: CEA: 0.6 ng/mL (ref 0.0–5.0)

## 2014-08-16 MED ORDER — IOHEXOL 300 MG/ML  SOLN
100.0000 mL | Freq: Once | INTRAMUSCULAR | Status: AC | PRN
  Administered 2014-08-16: 100 mL via INTRAVENOUS

## 2014-08-16 NOTE — Telephone Encounter (Signed)
Phone call to patient's cell phone.  Left message per DPI.  Blood work showing elevated bilirubin, but all otherwise normal.   CT scan showing a small cyst of the left ovary and a potentially dilated left fallopian tube.  They do not see signs of an abscess.  The uterus showed some mild changes that the CT scan could not define well (We saw a fibroid in the office ultrasound.) The remained of the CT scan was normal.    I am recommending a followup pelvic ultrasound for this next week as the size of the hydrosalpinx enlarged from last week to this one.   Please call patient in follow up to my call to schedule this.   No further abx at this time.

## 2014-08-19 ENCOUNTER — Telehealth: Payer: Self-pay | Admitting: Emergency Medicine

## 2014-08-19 DIAGNOSIS — N7011 Chronic salpingitis: Secondary | ICD-10-CM

## 2014-08-19 NOTE — Telephone Encounter (Signed)
Call to patient. Advised of benefit quote received for PUS. Patient agreeable.

## 2014-08-19 NOTE — Telephone Encounter (Signed)
Message left to return call to Jayle Solarz at [phone].    

## 2014-08-19 NOTE — Telephone Encounter (Signed)
Karina Cobbs, MD Physician Signed  Service date: 08/16/2014 1:06 PM  Bookmark Copy   Expand All Collapse All Phone call to patient's cell phone.  Left message per DPI.   Blood work showing elevated bilirubin, but all otherwise normal.    CT scan showing a small cyst of the left ovary and a potentially dilated left fallopian tube.   They do not see signs of an abscess.   The uterus showed some mild changes that the CT scan could not define well (We saw a fibroid in the office ultrasound.) The remained of the CT scan was normal.     I am recommending a followup pelvic ultrasound for this next week as the size of the hydrosalpinx enlarged from last week to this one.    Please call patient in follow up to my call to schedule this.    No further abx at this time.

## 2014-08-19 NOTE — Telephone Encounter (Signed)
Patient returned call and messages from Dr. Quincy Simmonds were discussed. Patient verbalized understanding of results and plan of care going forward.   Patient is scheduled for Pelvic ultrasound with Dr. Quincy Simmonds for 08/22/14.  Patient advised would be contacted with insurance coverage information.    cc Bowen to provider for final review. Patient agreeable to disposition. Will close encounter.

## 2014-08-19 NOTE — Telephone Encounter (Signed)
-----   Message from Nunzio Cobbs, MD sent at 08/16/2014  6:08 AM EDT ----- Please let patient know that her labs show only mildly elevated bilirubin, which she has had in the past.  This number is lower than it was last time it was checked one month ago.  The screening tests for cancer, CA125 and CEA were negative and normal.  (These do not rule out all types of ovarian and colon cancer, unfortunately.)  White blood cell count shows no sign of infection.  Cc- Marisa Sprinkles

## 2014-08-22 ENCOUNTER — Telehealth: Payer: Self-pay | Admitting: Nurse Practitioner

## 2014-08-22 ENCOUNTER — Ambulatory Visit (INDEPENDENT_AMBULATORY_CARE_PROVIDER_SITE_OTHER): Payer: BLUE CROSS/BLUE SHIELD

## 2014-08-22 ENCOUNTER — Ambulatory Visit (INDEPENDENT_AMBULATORY_CARE_PROVIDER_SITE_OTHER): Payer: BLUE CROSS/BLUE SHIELD | Admitting: Obstetrics and Gynecology

## 2014-08-22 ENCOUNTER — Encounter: Payer: Self-pay | Admitting: Obstetrics and Gynecology

## 2014-08-22 VITALS — BP 134/82 | HR 64 | Ht 66.0 in | Wt 128.0 lb

## 2014-08-22 DIAGNOSIS — R1900 Intra-abdominal and pelvic swelling, mass and lump, unspecified site: Secondary | ICD-10-CM | POA: Diagnosis not present

## 2014-08-22 DIAGNOSIS — N840 Polyp of corpus uteri: Secondary | ICD-10-CM | POA: Diagnosis not present

## 2014-08-22 DIAGNOSIS — R17 Unspecified jaundice: Secondary | ICD-10-CM

## 2014-08-22 DIAGNOSIS — N7011 Chronic salpingitis: Secondary | ICD-10-CM

## 2014-08-22 NOTE — Progress Notes (Signed)
Patient scheduled for new patient appointment with Dr. Everitt Amber at [facility] at [address] for 10/03/14 at 1045.  Patient given appointment information verbally and in after visit summary.

## 2014-08-22 NOTE — Telephone Encounter (Signed)
Call received from Dr. Elza Rafter office for new patient consult for evaluation and treatment regarding L adnexal mass. Records in [place]. Patient prefer's early July apt with Dr. Denman George. Apt given 10/03/14 at 11:00, arrive at 10:45 with insurance information and updated medication list. Representative on the phone gave all apt info to patient at time of scheduling. She verbalizes understanding.

## 2014-08-22 NOTE — Progress Notes (Signed)
Subjective  49 y.o. [id number]  Married caucasian female here for pelvic ultrasound for follow up of left adnexal mass.   Feels so much better overall.  No more bleeding.  No pain.  Doing normal activity.  Back to biking.   CT scan stated no tubo-ovarian abscess.  Cystic area of left ovary and potential hydrosalpinx.  All blood work to date is negative - negative CEA, CA125.  Normal CBC and CMP with the exception of elevated bilirubin.   EMB showed benign polyp.  Works for [employer].  Works at Publishing rights manager.  Does yard work.  Does volunteering at church.  President of the naturalists club.   Patient is status post MI.  Is currently on Effient.   Patient is going on an adventure trip on June 12 to backpack in the [employer] in [place].  Objective  Pelvic ultrasound images and report reviewed with patient.  Uterus - (previously seen 1.4 cm fibroid.) EMS - 7.73 mm Ovaries - normal right ovary.  Left ovary with 40 x 24 x 27 mm mixed echogenicity mass.  Blood flow to the mass noted.  Hydrosalpinx also seen separately from the mass. Free fluid - no     Assessment  Complex left adnexal mass.  Solid and cystic.  Normal CA125 and CEA.  Status post empiric abx with Flagyl and Levaquin.  Benign endometrial polyp. Elevated bilirubin level.  History of MI.   Plan  Ultrasound findings discussed.  I would recommend that patient have GYN ONC consultation with Dr. Everitt Amber or one of her colleagues to determine if surgical care is needed.  Patient is in agreement with this plan.   ___15____ minutes face to face time of which over 50% was spent in counseling.   After visit summary to patient.

## 2014-08-22 NOTE — Progress Notes (Signed)
Patient ID: Karina Richards, female   DOB: [birth year], 49 y.o.   MRN: [id number] GYNECOLOGY  VISIT   HPI: 49 y.o.   Married  Caucasian  female   [id number] with Patient's last menstrual period was 07/03/2014 (exact date).   here for follow up pelvic ultrasound.   See other note for ultrasound findings.  GYNECOLOGIC HISTORY: Patient's last menstrual period was 07/03/2014 (exact date). Contraception:  Condoms everytime (missed only 1 time) Menopausal hormone therapy: n/a Last mammogram: 06-11-14 dense/nl:[facility] Last pap smear: 06-03-14 wnl:neg HR HPV        OB History    Gravida Para Term Preterm AB TAB SAB Ectopic Multiple Living   3 2  2 1  1   2          Patient Active Problem List   Diagnosis Date Noted  . Ischemic cardiomyopathy 11/27/2013  . History of oral aphthous ulcers 03/19/2013  . Coronary atherosclerosis of native coronary artery 03/19/2013  . Hyperlipidemia 01/04/2013    Class: Chronic  . Chronic diastolic heart failure 34/19/6222    Class: Acute  . Old MI (myocardial infarction) 12/31/2012    Class: Acute    Past Medical History  Diagnosis Date  . Herpes simplex type 1 infection   . Coronary artery disease   . Myocardial infarction   . CHF (congestive heart failure)     Past Surgical History  Procedure Laterality Date  . Inguinal hernia repair Right 2008  . Left heart catheterization with coronary angiogram N/A 12/31/2012    LAD DES    Current Outpatient Prescriptions  Medication Sig Dispense Refill  . aspirin EC 81 MG EC tablet Take 1 tablet (81 mg total) by mouth daily.    Marland Kitchen atorvastatin (LIPITOR) 40 MG tablet Take 1 tablet (40 mg total) by mouth at bedtime. 90 tablet 3  . Cholecalciferol LIQD Take 4,000 Units by mouth daily. Uses one drop on the tongue each morning    . Folic Acid POWD Take 1 Package by mouth daily.    Marland Kitchen levofloxacin (LEVAQUIN) 500 MG tablet Take 1 tablet (500 mg total) by mouth daily. 14 tablet 0  . metroNIDAZOLE (FLAGYL) 500  MG tablet Flagyl 500 mg po bid for 2 weeks 28 tablet 0  . Milk Thistle 250 MG CAPS Take 250 mg by mouth daily.    . nitroGLYCERIN (NITROSTAT) 0.4 MG SL tablet Place 1 tablet (0.4 mg total) under the tongue every 5 (five) minutes x 3 doses as needed for chest pain. 25 tablet 12  . NON FORMULARY Take 1 tablet by mouth daily. RAINBOW LIGHT A MAGNESIUM / CALCIUM CHEWABLE TABLET    . prasugrel (EFFIENT) 10 MG TABS tablet Take 1 tablet (10 mg total) by mouth daily. 90 tablet 3   No current facility-administered medications for this visit.     ALLERGIES: Review of patient's allergies indicates no known allergies.  Family History  Problem Relation Age of Onset  . Osteoporosis Mother   . Heart disease Mother   . COPD Mother   . Heart disease Father   . Heart attack Maternal Grandmother     History   Social History  . Marital Status: Married    Spouse Name: N/A  . Number of Children: N/A  . Years of Education: N/A   Occupational History  . Not on file.   Social History Main Topics  . Smoking status: Never Smoker   . Smokeless tobacco: Never Used  . Alcohol Use: 1.2 oz/week  2 Standard drinks or equivalent per week  . Drug Use: No  . Sexual Activity:    Partners: Male    Birth Control/ Protection: Condom   Other Topics Concern  . Not on file   Social History Narrative    ROS:  Pertinent items are noted in HPI.  PHYSICAL EXAMINATION:    BP 134/82 mmHg  Pulse 64  Ht 5\' 6"  (1.676 m)  Wt 128 lb (58.06 kg)  BMI 20.67 kg/m2  LMP 07/03/2014 (Exact Date)    General appearance: alert, cooperative and appears stated age   ASSESSMENT  Complex left adnexal mass.  Solid and cystic.  Normal CA125 and CEA.  Status post empiric abx with Flagyl and Levaquin.  Benign endometrial polyp. Elevated bilirubin level.  History of MI.   PLAN  Ultrasound findings discussed.  I would recommend that patient have GYN ONC consultation with Dr. Everitt Amber or one of her colleagues to  determine if surgical care is needed.  Patient is in agreement with this plan.    An After Visit Summary was printed and given to the patient.  __15____ minutes face to face time of which over 50% was spent in counseling.

## 2014-08-22 NOTE — Patient Instructions (Signed)
You now have an appointment with the Gynecologic Oncologist at the [facility] located at the [facility]. The appointment is scheduled for 10/03/14 at 1045 with Dr. Denman George.   They would like you to arrive 30 minutes early for registration. Please bring your photo identification card and a copy of your insurance card. Please be aware that the Doctor will complete a pelvic exam on this day as well.   The address is: [facility] [address] [place], [place] [address]  If this appointment time will not work for you, please let our office know and we can have the appointment changed or you may also call [phone] for their office directly.   Please let me know if you have any further questions regarding your appointment.

## 2014-10-03 ENCOUNTER — Encounter: Payer: Self-pay | Admitting: Gynecologic Oncology

## 2014-10-03 ENCOUNTER — Ambulatory Visit: Payer: BLUE CROSS/BLUE SHIELD | Attending: Gynecologic Oncology | Admitting: Gynecologic Oncology

## 2014-10-03 VITALS — BP 140/87 | HR 64 | Temp 98.4°F | Resp 20 | Ht 66.0 in | Wt 124.1 lb

## 2014-10-03 DIAGNOSIS — N949 Unspecified condition associated with female genital organs and menstrual cycle: Secondary | ICD-10-CM | POA: Diagnosis not present

## 2014-10-03 DIAGNOSIS — N9489 Other specified conditions associated with female genital organs and menstrual cycle: Secondary | ICD-10-CM

## 2014-10-03 NOTE — Patient Instructions (Signed)
Plan to follow up with Dr. Quincy Simmonds in October for repeat ultrasound and CA 125 to assess the ovarian cyst.  Also, at that time, if you are still having vaginal bleeding, it would be recommended that you have an endometrial biopsy.  Please call Dr. Elza Rafter office to schedule your appt for October.  Follow up with Dr. Denman George as needed.  Call for any questions or concerns.

## 2014-10-03 NOTE — Progress Notes (Signed)
Consult Note: Gyn-Onc  Consult was requested by Dr. Quincy Simmonds for the evaluation of Karina Richards 49 y.o. female  CC:  Chief Complaint  Patient presents with  . Adnexal Mass    Assessment/Plan:  Karina Richards  is a 49 y.o.  year old with a complex left adnexal mass in the setting of a normal CA-125.  I performed a history, physical examination, and personally reviewed the patient's imaging films including the films from the CT abdo/pelvis on 08/16/14 and the ultrasound images from 08/22/14.  I discussed that Karina Richards has a low probability of having an underlying occult ovarian cancer. If she were low risk for surgery I would recommend a procedure to remove the ovary for definitive diagnosis. However, with her history of severe coronary artery disease and resultant ischemic congestive heart failure, I concerns about subjecting her to a diagnostic major operative procedure when my suspicion for underlying malignancy is low.  Instead I am recommending repeat imaging with transvaginal ultrasound scan in 3 months time (October 2016) and repeat CA-125 at that time. If there is an upward trend in the CA-125 or progressive growth in the ovarian mass I would then recommend surgery with robotic assisted laparoscopic salpingo-oophorectomy, frozen section, and staging if malignancy is identified. If the mass and CA-125 remain stable over this 3 month course then I believe it is reasonable to discontinue follow-up of the ovarian lesion.  With respect to her abnormal uterine bleeding, I believe it is most likely a function of her perimenopausal state. Her endometrial stripe was only 7 mm on ultrasound in May 2016 however per patient she is postmenopausal by labs. I discussed that if she has ongoing abnormal bleeding she should consider having an endometrial biopsy. We offered her this today however due to cervical stenosis the biopsy was not possible or tolerated by the patient. If she has ongoing vaginal  bleeding between now and her October repeat ultrasound scan would recommend an attempted endometrial sampling at that time.   HPI: Karina Richards is a therapy wasn't [age]-year-old gravida 3 para 0212 who is seen in consultation at the request of Dr. Quincy Simmonds for a left complex ovarian mass. The patient developed an episode of heavy vaginal bleeding in April 2016 after being amenorrheic for approximately one year. Labs revealed that she was postmenopausal. A transvaginal ultrasound scan was ordered and performed which demonstrated a complex left ovarian cyst measuring 4 x 2.4 x 2.7 cm with mixed echogenicity. A hydrosalpinx was seen separate from the mass. She was empirically treated with antibody Cipro and Flagyl and reports feeling better with this at this therapy. A CT scan of the abdomen and pelvis was performed on 08/16/2014 and this revealed no evidence of metastatic ovarian cancer, no lymphadenopathy, no ascites, but the left ovary notable for a 1.6 cm simple appearing cyst/follicle with mildly prominent tubular structures which could possibly represent a dilated fallopian tube.  Of interest the patient has a significant family and personal history for coronary artery disease. She experienced a myocardial infarction at age 52 in 49. She underwent cardiac catheterization with platelet placement of an LAD DES. She is on aspirin and prasugel antiplatelet therapy. She has documented ischemic cardiomyopathy. Her cardiologist is Dr. Daneen Schick III. She has good performance status and hikes for recreation with some fatigue but no shortness of breath or chest pain with this activity.   Interval History: He denies symptoms concerning for metastatic ovarian cancer including bloating, early satiety, pelvic or abdominal pain. Her  last episode of vaginal bleeding was 2 weeks ago.  Current Meds:  Outpatient Encounter Prescriptions as of 10/03/2014  Medication Sig  . aspirin EC 81 MG EC tablet Take 1 tablet (81 mg  total) by mouth daily.  . Cholecalciferol LIQD Take 4,000 Units by mouth daily. Uses one drop on the tongue each morning  . Folic Acid POWD Take 1 Package by mouth daily.  . Milk Thistle 250 MG CAPS Take 250 mg by mouth daily.  . prasugrel (EFFIENT) 10 MG TABS tablet Take 1 tablet (10 mg total) by mouth daily.  Marland Kitchen atorvastatin (LIPITOR) 40 MG tablet Take 1 tablet (40 mg total) by mouth at bedtime. (Patient not taking: Reported on 10/03/2014)  . nitroGLYCERIN (NITROSTAT) 0.4 MG SL tablet Place 1 tablet (0.4 mg total) under the tongue every 5 (five) minutes x 3 doses as needed for chest pain. (Patient not taking: Reported on 10/03/2014)  . NON FORMULARY Take 1 tablet by mouth daily. RAINBOW LIGHT A MAGNESIUM / CALCIUM CHEWABLE TABLET  . [DISCONTINUED] levofloxacin (LEVAQUIN) 500 MG tablet Take 1 tablet (500 mg total) by mouth daily. (Patient not taking: Reported on 10/03/2014)  . [DISCONTINUED] metroNIDAZOLE (FLAGYL) 500 MG tablet Flagyl 500 mg po bid for 2 weeks (Patient not taking: Reported on 10/03/2014)   No facility-administered encounter medications on file as of 10/03/2014.    Allergy: No Known Allergies  Social Hx:   History   Social History  . Marital Status: Married    Spouse Name: N/A  . Number of Children: N/A  . Years of Education: N/A   Occupational History  . Not on file.   Social History Main Topics  . Smoking status: Never Smoker   . Smokeless tobacco: Never Used  . Alcohol Use: 1.2 oz/week    2 Standard drinks or equivalent per week  . Drug Use: No  . Sexual Activity:    Partners: Male    Birth Control/ Protection: Condom   Other Topics Concern  . Not on file   Social History Narrative    Past Surgical Hx:  Past Surgical History  Procedure Laterality Date  . Inguinal hernia repair Right 2008  . Left heart catheterization with coronary angiogram N/A 12/31/2012    LAD DES    Past Medical Hx:  Past Medical History  Diagnosis Date  . Herpes simplex type 1  infection   . Coronary artery disease   . Myocardial infarction   . CHF (congestive heart failure)     Past Gynecological History:   No LMP recorded.  Family Hx:  Family History  Problem Relation Age of Onset  . Osteoporosis Mother   . Heart disease Mother   . COPD Mother   . Heart disease Father   . Heart attack Maternal Grandmother     Review of Systems:  Constitutional  Feels well,    ENT Normal appearing ears and nares bilaterally Skin/Breast  No rash, sores, jaundice, itching, dryness Cardiovascular  No chest pain, shortness of breath, or edema  Pulmonary  No cough or wheeze.  Gastro Intestinal  No nausea, vomitting, or diarrhoea. No bright red blood per rectum, no abdominal pain, change in bowel movement, or constipation.  Genito Urinary  No frequency, urgency, dysuria, see HPI Musculo Skeletal  No myalgia, arthralgia, joint swelling or pain  Neurologic  No weakness, numbness, change in gait,  Psychology  No depression, anxiety, insomnia.   Vitals:  Blood pressure 140/87, pulse 64, temperature 98.4 F (36.9 C), temperature  source Oral, resp. rate 20, height 5\' 6"  (1.676 m), weight 124 lb 1.6 oz (56.291 kg), SpO2 100 %.  Physical Exam: WD in NAD Neck  Supple NROM, without any enlargements.  Lymph Node Survey No cervical supraclavicular or inguinal adenopathy Cardiovascular  Pulse normal rate, regularity and rhythm. S1 and S2 normal.  Lungs  Clear to auscultation bilateraly, without wheezes/crackles/rhonchi. Good air movement.  Skin  No rash/lesions/breakdown  Psychiatry  Alert and oriented to person, place, and time  Abdomen  Normoactive bowel sounds, abdomen soft, non-tender and thin without evidence of hernia.  Back No CVA tenderness Genito Urinary  Vulva/vagina: Normal external female genitalia.  No lesions. No discharge or bleeding.  Bladder/urethra:  No lesions or masses, well supported bladder  Vagina: normal  Cervix: Normal appearing, no  lesions.  Uterus: Small, mobile, no parametrial involvement or nodularity. Endometrial biopsy attempted but unable to penetrate cervix secondary to cervical stenosis.  Adnexa: no palpable masses. Rectal  Good tone, no masses no cul de sac nodularity.  Extremities  No bilateral cyanosis, clubbing or edema.   Donaciano Eva, MD   10/03/2014, 5:54 PM

## 2014-10-10 NOTE — Progress Notes (Signed)
Patient has completed her consultation with Dr. Denman George. Adnexal mass if most likely benign.   Please contact patient back to do planning and scheduling. Recommendations are as follows: 1- pelvic ultrasound in October 2016 with me.  2 - endometrial biopsy IF she has continued postmenopausal bleeding at that time.  These will need to be precerted.   Patient is on Effient and will need to stop this prior to EMB.  Guidelines for this will need to come from her medical provider.   Cc- Evalee Mutton

## 2014-10-11 ENCOUNTER — Telehealth: Payer: Self-pay

## 2014-10-11 DIAGNOSIS — R1900 Intra-abdominal and pelvic swelling, mass and lump, unspecified site: Secondary | ICD-10-CM

## 2014-10-11 NOTE — Telephone Encounter (Signed)
Spoke with patient. Advised of message as seen below from [place]. Patient is agreeable and verbalizes understanding. PUS scheduled for 01/16/2015 at 11:30am with 12 pm consult with Dr.Silva. Patient is agreeable to date and time. Order placed. Will need precert.  Cc: Theresia Lo  Routing to provider for final review. Patient agreeable to disposition. Will close encounter.   Patient aware provider will review message and nurse will return call if any additional advice or change of disposition.

## 2014-10-11 NOTE — Telephone Encounter (Signed)
Left message to call [name] at [phone]. 

## 2014-10-11 NOTE — Telephone Encounter (Signed)
-----   Message from Karina Cobbs, MD sent at 10/10/2014  5:53 PM EDT -----   ----- Message -----    From: Karina Amber, MD    Sent: 10/03/2014   6:03 PM      To: Karina Cobbs, MD  Dear Karina Richards,  I saw Karina Richards today. I think that this mass is most likely benign. However given its complex features on ultrasound I do recommend a follow-up imaging scan be performed approximately 3 months afterwards (October 2016) and repeat CA-125 at that time to monitor for upper trend in CA-125 or increasing size in the ovarian cyst. With respect to her abnormal uterine bleeding in the setting of labs that show she is postmenopausal, I would recommend sampling of the endometrium at that time if she has persistent vaginal bleeding. Thanks for sending her to see me.  Warm regards, Karina Richards.

## 2014-12-16 ENCOUNTER — Telehealth: Payer: Self-pay | Admitting: Interventional Cardiology

## 2014-12-16 NOTE — Telephone Encounter (Signed)
New message      Pt had a heart attack 12-31-2012.  Can she give blood on 10-4?

## 2014-12-16 NOTE — Telephone Encounter (Signed)
Returned pt call.[email or web address] 

## 2014-12-17 NOTE — Telephone Encounter (Signed)
Spoke with pt. Pt sts that she d/c Brilinta on 11/30/14. Pt sts that she has been doing well from a cardiac standpoint. Pt would like to know if it would be ok to donate blood with the [facility] on 12/31/14. Adv her that [employer] guidelines are that anti-platelet meds be d/c at least 2wks before donating. I will fwd a message to Dr.Smith to see if from a cardiac standpoint if it would be ok. Pt rqst a detailed meassge be left on her cell with Dr.Smith's response

## 2014-12-18 NOTE — Telephone Encounter (Signed)
It will be okay to eventually donate blood.

## 2014-12-19 NOTE — Telephone Encounter (Signed)
Per pt rqst ok to leave detailed message on machine with Dr.Smith's response. It will be okay to eventually donate blood Pt is to call back if she has any questions.

## 2015-01-03 ENCOUNTER — Telehealth: Payer: Self-pay | Admitting: Obstetrics and Gynecology

## 2015-01-03 NOTE — Telephone Encounter (Signed)
Returning a call to Rebecca

## 2015-01-03 NOTE — Telephone Encounter (Signed)
Call to patient. Advised ultrasound reccommended by Dr. Quincy Simmonds and Dr. Denman George to evaluated for pelvic mass that was seen previously. So glad to hear she is feeling much better, but need to still check ovaries via ultrasound.  Patient verbalized understanding and is agreeable.  Will keep ultrasound appointment as scheduled.  Routing to provider for final review. Patient agreeable to disposition. Will close encounter.

## 2015-01-03 NOTE — Telephone Encounter (Signed)
Returned call. Left voicemail.

## 2015-01-03 NOTE — Telephone Encounter (Signed)
Called patient to review benefits for procedure. Left voicemail to call back and review. °

## 2015-01-03 NOTE — Telephone Encounter (Signed)
Spoke with patient. Reviewed benefit for PUS scheduled 01/16/15. Patient understands and agreeable. Patient aware of appointment date/time and asks if necessary to keep appointment stating, "I'm doing so much better now". Patient agreeable to leaving appointment as scheduled until call from nurse to review.

## 2015-01-10 ENCOUNTER — Telehealth: Payer: Self-pay | Admitting: Obstetrics and Gynecology

## 2015-01-10 NOTE — Telephone Encounter (Signed)
Patient canceled her ultrasound and would like to reschedule in November during the week of 14-18th if possible

## 2015-01-13 NOTE — Telephone Encounter (Signed)
Thank you for the update.  I have closed the encounter.  

## 2015-01-13 NOTE — Telephone Encounter (Signed)
Spoke with patient. Patient originally scheduled 01/16/15 due to pelvic mass. She requested to move PUS to 02/13/15 based on work schedule. I offered her multiple other dates available sooner. Patient stated needs to have 11/17 or after due to work. Patient scheduled for 02/13/15 with Dr Quincy Simmonds. Routing to triage for review due to diagnosis for PUS.

## 2015-01-13 NOTE — Telephone Encounter (Signed)
Routing to Dr. Quincy Simmonds to review.  Date Per patient request due to work.  Okay to close?

## 2015-01-16 ENCOUNTER — Other Ambulatory Visit: Payer: BLUE CROSS/BLUE SHIELD | Admitting: Obstetrics and Gynecology

## 2015-01-16 ENCOUNTER — Other Ambulatory Visit: Payer: BLUE CROSS/BLUE SHIELD

## 2015-01-19 ENCOUNTER — Other Ambulatory Visit: Payer: Self-pay | Admitting: Physician Assistant

## 2015-01-23 ENCOUNTER — Ambulatory Visit (INDEPENDENT_AMBULATORY_CARE_PROVIDER_SITE_OTHER): Payer: BLUE CROSS/BLUE SHIELD | Admitting: Interventional Cardiology

## 2015-01-23 ENCOUNTER — Encounter: Payer: Self-pay | Admitting: Interventional Cardiology

## 2015-01-23 VITALS — BP 134/88 | HR 61 | Ht 66.5 in | Wt 125.8 lb

## 2015-01-23 DIAGNOSIS — I2510 Atherosclerotic heart disease of native coronary artery without angina pectoris: Secondary | ICD-10-CM | POA: Diagnosis not present

## 2015-01-23 DIAGNOSIS — I5032 Chronic diastolic (congestive) heart failure: Secondary | ICD-10-CM

## 2015-01-23 DIAGNOSIS — E785 Hyperlipidemia, unspecified: Secondary | ICD-10-CM

## 2015-01-23 MED ORDER — ATORVASTATIN CALCIUM 40 MG PO TABS: 40.0000 mg | ORAL_TABLET | Freq: Every day | ORAL | Status: DC

## 2015-01-23 NOTE — Progress Notes (Signed)
Cardiology Office Note   Date:  01/23/2015   ID:  Rashauna Tep, DOB [birth year], MRN [id number]  PCP:  Manon Hilding, MD  Cardiologist:  Sinclair Grooms, MD   Chief Complaint  Patient presents with  . Coronary Artery Disease      History of Present Illness: Karina Richards is a 49 y.o. 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  Now off of Effient therapy. No ischemic complaints. She has discontinued lipid therapy. Last LDL was then April 2016 and was 85. She was on 40 mg of atorvastatin at that time.    Past Medical History  Diagnosis Date  . Herpes simplex type 1 infection   . Coronary artery disease   . Myocardial infarction ([place])   . CHF (congestive heart failure) [facility])     Past Surgical History  Procedure Laterality Date  . Inguinal hernia repair Right 2008  . Left heart catheterization with coronary angiogram N/A 12/31/2012    LAD DES     Current Outpatient Prescriptions  Medication Sig Dispense Refill  . aspirin EC 81 MG EC tablet Take 1 tablet (81 mg total) by mouth daily.    . Cholecalciferol LIQD Take 4,000 Units by mouth daily. Uses one drop on the tongue each morning    . Folic Acid POWD Take 1 Package by mouth daily.    . Milk Thistle 250 MG CAPS Take 250 mg by mouth daily.    . nitroGLYCERIN (NITROSTAT) 0.4 MG SL tablet Place 1 tablet (0.4 mg total) under the tongue every 5 (five) minutes x 3 doses as needed for chest pain. 25 tablet 12  . NON FORMULARY Take 1 tablet by mouth daily. RAINBOW LIGHT A MAGNESIUM / CALCIUM CHEWABLE TABLET     No current facility-administered medications for this visit.    Allergies:   Review of patient's allergies indicates no known allergies.    Social History:  The patient  reports that she has never smoked. She has never used smokeless tobacco. She reports that she drinks about 1.2 oz of alcohol per week. She reports that she  does not use illicit drugs.   Family History:  The patient's family history includes COPD in her mother; Heart attack in her maternal grandmother; Heart disease in her father and mother; Osteoporosis in her mother.    ROS:  Please see the history of present illness.   Otherwise, review of systems are positive for none.   All other systems are reviewed and negative.    PHYSICAL EXAM: VS:  BP 134/88 mmHg  Pulse 61  Ht 5' 6.5" (1.689 m)  Wt 57.063 kg (125 lb 12.8 oz)  BMI 20.00 kg/m2  LMP  , BMI Body mass index is 20 kg/(m^2). GEN: Well nourished, well developed, in no acute distress HEENT: normal Neck: no JVD, carotid bruits, or masses Cardiac: RRR.  There is no murmur, rub, or gallop. There is no edema. Respiratory:  clear to auscultation bilaterally, normal work of breathing. GI: soft, nontender, nondistended, + BS MS: no deformity or atrophy Skin: warm and dry, no rash Neuro:  Strength and sensation are intact Psych: euthymic mood, full affect   EKG:  EKG is not ordered today.    Recent Labs: 08/15/2014: ALT 17; BUN 18; Creat 0.93; Hemoglobin 14.6; Platelets 200; Potassium 4.1; Sodium 135    Lipid Panel    Component Value Date/Time   CHOL 144 07/03/2014 1239   TRIG  43.0 07/03/2014 1239   HDL 51.70 07/03/2014 1239   CHOLHDL 3 07/03/2014 1239   VLDL 8.6 07/03/2014 1239   LDLCALC 84 07/03/2014 1239   LDLDIRECT 202.3 03/27/2013 1412      Wt Readings from Last 3 Encounters:  01/23/15 57.063 kg (125 lb 12.8 oz)  10/03/14 56.291 kg (124 lb 1.6 oz)  08/22/14 58.06 kg (128 lb)      Other studies Reviewed: Additional studies/ records that were reviewed today include: Last lipid panel reviewed. The findings include on atorvastatin 40 mg daily LDL was 85.    ASSESSMENT AND PLAN:  1. Atherosclerosis of native coronary artery of native heart without angina pectoris Asymptomatic. Stented during acute coronary dissection.  2. Chronic diastolic heart failure (HCC) No  evidence of iron overload  3. Hyperlipidemia Patient has gotten off of statin therapy. I have encouraged that she resume treatment.    Current medicines are reviewed at length with the patient today.  The patient has the following concerns regarding medicines: None.  The following changes/actions have been instituted:    Resume atorvastatin 40 mg per day  Liver and lipid panel in April  Follow-up in one year for clinical assessment  Labs/ tests ordered today include:  No orders of the defined types were placed in this encounter.     Disposition:   FU with HS in 1 year  Signed, Sinclair Grooms, MD  01/23/2015 12:13 PM    [facility] [address], [place], [place]  [address] Phone: [phone]; Fax: [phone]

## 2015-01-23 NOTE — Patient Instructions (Signed)
Your physician recommends that you continue on your current medications as directed. Please refer to the Current Medication list given to you today. Your physician recommends that you return for lab work in: April 2017 (lipids, liver function)  Your physician wants you to follow-up in: 1 year with Dr. Tamala Julian.  You will receive a reminder letter in the mail two months in advance. If you don't receive a letter, please call our office to schedule the follow-up appointment.

## 2015-02-13 ENCOUNTER — Ambulatory Visit (INDEPENDENT_AMBULATORY_CARE_PROVIDER_SITE_OTHER): Payer: BLUE CROSS/BLUE SHIELD | Admitting: Obstetrics and Gynecology

## 2015-02-13 ENCOUNTER — Ambulatory Visit (INDEPENDENT_AMBULATORY_CARE_PROVIDER_SITE_OTHER): Payer: BLUE CROSS/BLUE SHIELD

## 2015-02-13 ENCOUNTER — Encounter: Payer: Self-pay | Admitting: Obstetrics and Gynecology

## 2015-02-13 VITALS — BP 126/82 | HR 56 | Ht 66.0 in | Wt 126.0 lb

## 2015-02-13 DIAGNOSIS — N951 Menopausal and female climacteric states: Secondary | ICD-10-CM | POA: Diagnosis not present

## 2015-02-13 DIAGNOSIS — R1900 Intra-abdominal and pelvic swelling, mass and lump, unspecified site: Secondary | ICD-10-CM

## 2015-02-13 NOTE — Progress Notes (Signed)
Subjective  49 y.o. [id number] Caucasian female here for pelvic ultrasound for left adnexal mass.  Feels back to her normal self.  Doing increased activity again.  Cycles stopped 09/08/14.   No spotting at all.  Having hot flashes and voiding more often at night.  Estroven not helpful.  Last ultrasound was 08/22/14: Uterus - (previously seen 1.4 cm fibroid.) EMS - 7.73 mm Ovaries - normal right ovary. Left ovary with 40 x 24 x 27 mm mixed echogenicity mass. Blood flow to the mass noted. Hydrosalpinx also seen separately from the mass. Free fluid - no  Had GYN ONC consultation with Dr. Denman George on 10/03/14.  No surgical intervention recommended due to hx CAD.  EMB was not possible due to stenosis of the cervix.  Recommendations were made for return ultrasound in 3 months and EMB if abnormal vaginal bleeding persisted.  CA125 also  was recommended.  Patient is on a ASA 81 mg only now. Came off her blood thinner. Hx MI.   Objective  Pelvic ultrasound images and report reviewed with patient.  Uterus - 1.2 mm fibroid. EMS - 5.22 mm.  Ovaries - normal.  Solid left adnexal mass 14 x 16 mm, much decreased in size, no abnormal blood flow. Free fluid - no        Assessment  Menopausal symptoms.  Absent menses.  Solid left adnexal mass, much smaller in size.  Small uterine fibroid. Hx MI.   Plan  Discussion of menopausal symptoms and treatment with natural options - Estroven, SSRIs, SNRIs.  She will do a natural approach.  Will check FSH and estradiol.  Patient is not candidate for HRT. I do not believe she needs a course of Provera at this time.  Discussion of the solid left adnexal mass. Will check CA125 today.  Plan for pelvic ultrasound in 6 months.  Return for routine annual exam with Evalee Mutton in March 2017.  ___25____ minutes face to face time of which over 50% was spent in counseling.   After visit summary to patient.

## 2015-02-14 ENCOUNTER — Telehealth: Payer: Self-pay | Admitting: Emergency Medicine

## 2015-02-14 LAB — FOLLICLE STIMULATING HORMONE: FSH: 158.7 m[IU]/mL — AB

## 2015-02-14 LAB — CA 125: CA 125: 6 U/mL (ref <35)

## 2015-02-14 LAB — ESTRADIOL: ESTRADIOL: 38.3 pg/mL

## 2015-02-14 NOTE — Telephone Encounter (Signed)
-----   Message from Nunzio Cobbs, MD sent at 02/14/2015  6:51 AM EST ----- Please let patient know of her results. The CA125 is normal.  It went down to 6 from the previous level of 24.  It was in the normal range before as well, but it did go down a lot.  I wonder if the patient had an inflammatory process that is now resolving.  Her hormonal labs look like menopause.  Karina Richards, Karina Richards

## 2015-02-14 NOTE — Telephone Encounter (Signed)
Message left to return call to Khushboo Chuck at [phone].    

## 2015-03-13 NOTE — Telephone Encounter (Signed)
Call to patient. She is given message from Dr. Quincy Simmonds and verbalized understanding of results.  She will follow up as needed.  Routing to provider for final review. Patient agreeable to disposition. Will close encounter.

## 2015-06-06 ENCOUNTER — Encounter: Payer: Self-pay | Admitting: Certified Nurse Midwife

## 2015-06-06 ENCOUNTER — Ambulatory Visit (INDEPENDENT_AMBULATORY_CARE_PROVIDER_SITE_OTHER): Payer: BLUE CROSS/BLUE SHIELD | Admitting: Certified Nurse Midwife

## 2015-06-06 VITALS — BP 96/60 | HR 70 | Resp 16 | Ht 66.25 in | Wt 127.0 lb

## 2015-06-06 DIAGNOSIS — Z0000 Encounter for general adult medical examination without abnormal findings: Secondary | ICD-10-CM | POA: Diagnosis not present

## 2015-06-06 DIAGNOSIS — Z01419 Encounter for gynecological examination (general) (routine) without abnormal findings: Secondary | ICD-10-CM

## 2015-06-06 DIAGNOSIS — N912 Amenorrhea, unspecified: Secondary | ICD-10-CM | POA: Diagnosis not present

## 2015-06-06 DIAGNOSIS — N951 Menopausal and female climacteric states: Secondary | ICD-10-CM

## 2015-06-06 DIAGNOSIS — Z1211 Encounter for screening for malignant neoplasm of colon: Secondary | ICD-10-CM

## 2015-06-06 LAB — POCT URINALYSIS DIPSTICK
BILIRUBIN UA: NEGATIVE
GLUCOSE UA: NEGATIVE
KETONES UA: NEGATIVE
Leukocytes, UA: NEGATIVE
Nitrite, UA: NEGATIVE
PH UA: 5
Protein, UA: NEGATIVE
RBC UA: NEGATIVE
Urobilinogen, UA: NEGATIVE

## 2015-06-06 LAB — POCT URINE PREGNANCY: Preg Test, Ur: NEGATIVE

## 2015-06-06 NOTE — Patient Instructions (Signed)

## 2015-06-06 NOTE — Progress Notes (Signed)
50 y.o. [id number] Married  Caucasian Fe here for annual exam. Perimenopausal some hot flashes and night sweats. Had evaluation for excessive bleeding 5/16 with fibroids noted. Has not had any bleeding since that time. Continues to see cardiology for follow up of heart surgery, has labs there. Still not ready for colonoscopy exam,"too much in the past 2 years". May schedule next year. Sees PCP prn. Some vaginal dryness, but uses coconut oil with good results. No other health issues today. Celebrating 25 years of marriage with a trip to [place]!  Patient's last menstrual period was 09/08/2014.          Sexually active: Yes.    The current method of family planning is condoms all the time.    Exercising: Yes.    hiking,biking,general exercise Smoker:  no  Health Maintenance: Pap: 06-03-14 neg HPV HR neg MMG:  06-11-14 category c density,birads 1:neg Colonoscopy:  none BMD:   none TDaP:  2010 Shingles: no Pneumonia: no Hep C and HIV: not done Labs: poct urine-neg, upt-neg Self breast exam: done occ   reports that she has never smoked. She has never used smokeless tobacco. She reports that she drinks about 1.2 oz of alcohol per week. She reports that she does not use illicit drugs.  Past Medical History  Diagnosis Date  . Herpes simplex type 1 infection   . Coronary artery disease   . Myocardial infarction ([place])   . CHF (congestive heart failure) ([place])   . Adnexal mass 2016    left side    Past Surgical History  Procedure Laterality Date  . Inguinal hernia repair Right 2008  . Left heart catheterization with coronary angiogram N/A 12/31/2012    LAD DES    Current Outpatient Prescriptions  Medication Sig Dispense Refill  . aspirin EC 81 MG EC tablet Take 1 tablet (81 mg total) by mouth daily.    Marland Kitchen atorvastatin (LIPITOR) 40 MG tablet Take 1 tablet (40 mg total) by mouth daily. 90 tablet 3  . Cholecalciferol LIQD Take 4,000 Units by mouth daily. Uses one drop on the tongue each morning     . Folic Acid POWD Take 1 Package by mouth daily.    . Milk Thistle 250 MG CAPS Take 250 mg by mouth daily.    . nitroGLYCERIN (NITROSTAT) 0.4 MG SL tablet Place 1 tablet (0.4 mg total) under the tongue every 5 (five) minutes x 3 doses as needed for chest pain. 25 tablet 12  . NON FORMULARY Take 1 tablet by mouth daily. Reported on 06/06/2015     No current facility-administered medications for this visit.    Family History  Problem Relation Age of Onset  . Osteoporosis Mother   . Heart disease Mother   . COPD Mother   . Heart disease Father   . Heart attack Maternal Grandmother     ROS:  Pertinent items are noted in HPI.  Otherwise, a comprehensive ROS was negative.  Exam:   BP 96/60 mmHg  Pulse 70  Resp 16  Ht 5' 6.25" (1.683 m)  Wt 127 lb (57.607 kg)  BMI 20.34 kg/m2  LMP 09/08/2014 Height: 5' 6.25" (168.3 cm) Ht Readings from Last 3 Encounters:  06/06/15 5' 6.25" (1.683 m)  02/13/15 5\' 6"  (1.676 m)  01/23/15 5' 6.5" (1.689 m)    General appearance: alert, cooperative and appears stated age Head: Normocephalic, without obvious abnormality, atraumatic Neck: no adenopathy, supple, symmetrical, trachea midline and thyroid normal to inspection and palpation Lungs:  clear to auscultation bilaterally Breasts: normal appearance, no masses or tenderness, No nipple retraction or dimpling, No nipple discharge or bleeding, No axillary or supraclavicular adenopathy Heart: regular rate and rhythm Abdomen: soft, non-tender; no masses,  no organomegaly Extremities: extremities normal, atraumatic, no cyanosis or edema Skin: Skin color, texture, turgor normal. No rashes or lesions Lymph nodes: Cervical, supraclavicular, and axillary nodes normal. No abnormal inguinal nodes palpated Neurologic: Grossly normal   Pelvic: External genitalia:  no lesions              Urethra:  normal appearing urethra with no masses, tenderness or lesions              Bartholin's and Skene's: normal                  Vagina: normal appearing vagina with normal color and discharge, no lesions              Cervix: normal, non tender, no lesions              Pap taken: No. Bimanual Exam:  Uterus:  normal size, contour, position, consistency, mobility, non-tender              Adnexa: normal adnexa and no mass, fullness, tenderness               Rectovaginal: Confirms               Anus:  normal sphincter tone, no lesions  Chaperone present: yes  A:  Well Woman with normal exam  Contraception condoms  Perimenopausal no period since 07/03/14   History of fibroids   Cardiology management of medications  Colonoscopy due declines scheduling  P:   Reviewed health and wellness pertinent to exam  Aware of need to evaluate if has vaginal bleeding and etiology of menopause and expectations. Questions addressed.  Continue follow up with MD as indicated.  IFOB dispensed.  Pap smear as above not taken   counseled on breast self exam, mammography screening, menopause, adequate intake of calcium and vitamin D, diet and exercise  return annually or prn  An After Visit Summary was printed and given to the patient.

## 2015-06-09 NOTE — Progress Notes (Signed)
Encounter reviewed Adiya Selmer, MD   

## 2015-06-24 LAB — FECAL OCCULT BLOOD, IMMUNOCHEMICAL: IMMUNOLOGICAL FECAL OCCULT BLOOD TEST: NEGATIVE

## 2015-09-25 ENCOUNTER — Ambulatory Visit (INDEPENDENT_AMBULATORY_CARE_PROVIDER_SITE_OTHER): Payer: BLUE CROSS/BLUE SHIELD | Admitting: Otolaryngology

## 2015-10-30 ENCOUNTER — Ambulatory Visit (INDEPENDENT_AMBULATORY_CARE_PROVIDER_SITE_OTHER): Payer: BLUE CROSS/BLUE SHIELD | Admitting: Otolaryngology

## 2016-04-19 ENCOUNTER — Telehealth: Payer: Self-pay | Admitting: Obstetrics and Gynecology

## 2016-04-19 NOTE — Telephone Encounter (Signed)
Please contact patient in follow up for her pelvic ultrasound to check the solid left ovarian area we have been following.  She came up in my reminder box for an incomplete pelvic ultrasound.

## 2016-04-29 NOTE — Telephone Encounter (Signed)
Call to patient regarding annual exam (was scheduled for 06-11-16 and canceled due to provider schedule) to assist with reschedule.  Also follow up on recommended pelvic ultrasound to recheck left adnexal mass.  Voice mail confirms first and last name. Left message to call back.

## 2016-05-03 NOTE — Telephone Encounter (Signed)
Follow-up call to patient, left message to call back. Voice mail confirms first and last name. Left message ( per ROI, can leave message) calling to follow-up regarding appointments. Nothing wrong and no emergency.

## 2016-05-05 NOTE — Telephone Encounter (Signed)
Patient is returning a call to [place]. She is now schedule for her annual 06/09/16

## 2016-05-05 NOTE — Telephone Encounter (Signed)
Call back to patient. Voice mail confirms name. Left message confirming aware she has annual exam for 06-09-16 with Debbi. Calling to also follow-up on pelvic ultrasound to recheck area of left ovary. Left message to call back.

## 2016-05-07 NOTE — Telephone Encounter (Signed)
Patient returning call. Says she does not think she need a mammogram and would prefer not to get one at this time.

## 2016-05-07 NOTE — Telephone Encounter (Signed)
I do recommend a follow up pelvic ultrasound to recheck the solid mass.  I am hopeful that this may be the last time since the mass was getting smaller!  Cc- Karina Richards

## 2016-05-07 NOTE — Telephone Encounter (Signed)
Routing to Dr Quincy Simmonds for review and final disposition. Please advise.  Annual exam with French Ana, CNM on 06-09-16.

## 2016-06-04 NOTE — Telephone Encounter (Signed)
Annual exam still scheduled for 06-09-16 with Debbi. Re-routing to Debbi for discussion at annual exam appointment. Encounter closed.

## 2016-06-08 NOTE — Progress Notes (Signed)
51 y.o. [id number] Married  Caucasian Fe here for annual exam. Menopausal no HRT. Denies vaginal bleeding or vaginal dryness. Has been released from cardiology now. Limits heavy lifting at work now. Feels good, no issues. Eating well, exercises daily. 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 No other health issues today. Planning vacation this year. Screening labs.  Patient's last menstrual period was 09/08/2014.          Sexually active: Yes.    The current method of family planning is post menopausal status.    Exercising: Yes.    trampoline, ab workout, walking, bike riding,  Smoker:  no  Health Maintenance: Pap:  06-03-14 neg HPV HR neg MMG:  06-24-15 category c density birads 1:neg Colonoscopy:  none BMD:   none TDaP:  2010 Shingles: no Pneumonia: no Hep C and HIV: discuss today Labs: discuss today Self breast exam: occasionally   reports that she has never smoked. She has never used smokeless tobacco. She reports that she drinks about 1.2 oz of alcohol per week . She reports that she does not use drugs.  Past Medical History:  Diagnosis Date  . Adnexal mass 2016   left side  . CHF (congestive heart failure) ([place])   . Coronary artery disease   . Herpes simplex type 1 infection   . Myocardial infarction     Past Surgical History:  Procedure Laterality Date  . INGUINAL HERNIA REPAIR Right 2008  . LEFT HEART CATHETERIZATION WITH CORONARY ANGIOGRAM N/A 12/31/2012   LAD DES    Current Outpatient Prescriptions  Medication Sig Dispense Refill  . aspirin EC 81 MG EC tablet Take 1 tablet (81 mg total) by mouth daily.    . Cholecalciferol LIQD Take 4,000 Units by mouth daily. Uses one drop on the tongue each morning    . Milk Thistle 250 MG CAPS Take 250 mg by mouth daily.    . NON FORMULARY Take 1 tablet by mouth daily. Reported on 06/06/2015    . nitroGLYCERIN (NITROSTAT) 0.4 MG SL tablet Place 1 tablet (0.4 mg total) under the tongue every 5 (five) minutes x 3 doses as  needed for chest pain. (Patient not taking: Reported on 06/09/2016) 25 tablet 12   No current facility-administered medications for this visit.     Family History  Problem Relation Age of Onset  . Heart disease Father   . Osteoporosis Mother   . Heart disease Mother   . COPD Mother   . Heart attack Maternal Grandmother     ROS:  Pertinent items are noted in HPI.  Otherwise, a comprehensive ROS was negative.  Exam:   BP 116/70 (BP Location: Right Arm, Patient Position: Sitting, Cuff Size: Normal)   Pulse 64   Resp 16   Ht 5' 5.75" (1.67 m)   Wt 128 lb (58.1 kg)   LMP 09/08/2014   BMI 20.82 kg/m  Height: 5' 5.75" (167 cm) Ht Readings from Last 3 Encounters:  06/09/16 5' 5.75" (1.67 m)  06/06/15 5' 6.25" (1.683 m)  02/13/15 5\' 6"  (1.676 m)    General appearance: alert, cooperative and appears stated age Head: Normocephalic, without obvious abnormality, atraumatic Neck: no adenopathy, supple, symmetrical, trachea midline and thyroid normal to inspection and palpation Lungs: clear to auscultation bilaterally Breasts: normal appearance, no masses or tenderness, No nipple retraction or dimpling, No nipple discharge or bleeding, No axillary or supraclavicular adenopathy Heart: regular rate and rhythm Abdomen: soft, non-tender; no masses,  no organomegaly Extremities:  extremities normal, atraumatic, no cyanosis or edema Skin: Skin color, texture, turgor normal. No rashes or lesions Lymph nodes: Cervical, supraclavicular, and axillary nodes normal. No abnormal inguinal nodes palpated Neurologic: Grossly normal   Pelvic: External genitalia:  no lesions              Urethra:  normal appearing urethra with no masses, tenderness or lesions              Bartholin's and Skene's: normal                 Vagina: normal appearing vagina with normal color and discharge, no lesions              Cervix: multiparous appearance, no bleeding following Pap, no cervical motion tenderness and no  lesions              Pap taken: Yes.   Bimanual Exam:  Uterus:  normal size, contour, position, consistency, mobility, non-tender              Adnexa: normal adnexa and no mass, fullness, tenderness               Rectovaginal: Confirms               Anus:  normal sphincter tone, no lesions  Chaperone present: yes  A:  Well Woman with normal exam  Menopausal no HRT  Colonoscopy and mammogram due  History of chronic diastolic heart failure, sees cardiology prn  Screening labs    P:   Reviewed health and wellness pertinent to exam  Aware of need to evaluate if vaginal bleeding  Discussed risks and benefits of colonoscopy, questions addressed. Patient declines scheduling.  IFOB dispensed with instructions given.  Patient will schedule mammogram.  Discussed importance of follow up with Cardiology as needed and continue healthy diet and exercise. Aware of warning signs and need to evaluate.  Labs: CMP,Lipid panel, TSH, Vitamin D, Hep C  Pap smear as above with HPV reflex   counseled on breast self exam, mammography screening, menopause, adequate intake of calcium and vitamin D, diet and exercise  return annually or prn  An After Visit Summary was printed and given to the patient.

## 2016-06-09 ENCOUNTER — Encounter: Payer: Self-pay | Admitting: Certified Nurse Midwife

## 2016-06-09 ENCOUNTER — Ambulatory Visit (INDEPENDENT_AMBULATORY_CARE_PROVIDER_SITE_OTHER): Payer: BLUE CROSS/BLUE SHIELD | Admitting: Certified Nurse Midwife

## 2016-06-09 VITALS — BP 116/70 | HR 64 | Resp 16 | Ht 65.75 in | Wt 128.0 lb

## 2016-06-09 DIAGNOSIS — Z01419 Encounter for gynecological examination (general) (routine) without abnormal findings: Secondary | ICD-10-CM | POA: Diagnosis not present

## 2016-06-09 DIAGNOSIS — Z124 Encounter for screening for malignant neoplasm of cervix: Secondary | ICD-10-CM

## 2016-06-09 DIAGNOSIS — Z1211 Encounter for screening for malignant neoplasm of colon: Secondary | ICD-10-CM

## 2016-06-09 DIAGNOSIS — Z0000 Encounter for general adult medical examination without abnormal findings: Secondary | ICD-10-CM

## 2016-06-09 LAB — LIPID PANEL
Cholesterol: 336 mg/dL — ABNORMAL HIGH (ref <200)
HDL: 70 mg/dL (ref >50)
LDL Cholesterol: 249 mg/dL — ABNORMAL HIGH (ref <100)
TRIGLYCERIDES: 86 mg/dL (ref <150)
Total CHOL/HDL Ratio: 4.8 Ratio (ref <5.0)
VLDL: 17 mg/dL (ref <30)

## 2016-06-09 LAB — COMPREHENSIVE METABOLIC PANEL
ALBUMIN: 4.8 g/dL (ref 3.6–5.1)
ALT: 18 U/L (ref 6–29)
AST: 24 U/L (ref 10–35)
Alkaline Phosphatase: 56 U/L (ref 33–130)
BUN: 15 mg/dL (ref 7–25)
CALCIUM: 9.6 mg/dL (ref 8.6–10.4)
CHLORIDE: 103 mmol/L (ref 98–110)
CO2: 30 mmol/L (ref 20–31)
CREATININE: 0.86 mg/dL (ref 0.50–1.05)
Glucose, Bld: 76 mg/dL (ref 65–99)
POTASSIUM: 4.2 mmol/L (ref 3.5–5.3)
Sodium: 139 mmol/L (ref 135–146)
TOTAL PROTEIN: 7.2 g/dL (ref 6.1–8.1)
Total Bilirubin: 1.1 mg/dL (ref 0.2–1.2)

## 2016-06-09 LAB — TSH: TSH: 1.22 m[IU]/L

## 2016-06-09 NOTE — Patient Instructions (Signed)

## 2016-06-10 ENCOUNTER — Telehealth: Payer: Self-pay | Admitting: *Deleted

## 2016-06-10 LAB — VITAMIN D 25 HYDROXY (VIT D DEFICIENCY, FRACTURES): VIT D 25 HYDROXY: 57 ng/mL (ref 30–100)

## 2016-06-10 LAB — IPS PAP TEST WITH REFLEX TO HPV

## 2016-06-10 LAB — HEPATITIS C ANTIBODY: HCV AB: NEGATIVE

## 2016-06-10 NOTE — Telephone Encounter (Signed)
Spoke with patient, advised of results and recommendations as seen below per Melvia Heaps, CNM. Patient states she will follow up with pcp regarding elevated cholesterol. Advised will send copy of recent lab results to pcp. Patient verbalizes understanding and is agreeable.  Routing to provider for final review. Patient is agreeable to disposition. Will close encounter.

## 2016-06-10 NOTE — Telephone Encounter (Signed)
-----   Message from Regina Eck, CNM sent at 06/10/2016  8:08 AM EDT ----- Notify patient that her Hep C is negative Vitamin D is normal range, continue supplement TSH is normal Lipid panel Cholesterol is very elevated at 336,  Triglycerides are normal HDL is normal LDL cholesterol is elevated this is your harmful cholesterol. She needs follow up with her PCP regarding this elevation due her heart history. Also recommend stopping coconut oil, according the literature there is concern with elevation of cholesterol with use. Liver, kidney and glucose profile is normal

## 2016-06-10 NOTE — Telephone Encounter (Signed)
Left message to call Karina Richards at [phone].  

## 2016-06-11 ENCOUNTER — Ambulatory Visit: Payer: BLUE CROSS/BLUE SHIELD | Admitting: Certified Nurse Midwife

## 2016-06-14 NOTE — Progress Notes (Signed)
Encounter reviewed Karina Macchia, MD   

## 2016-06-28 LAB — FECAL OCCULT BLOOD, IMMUNOCHEMICAL: IMMUNOLOGICAL FECAL OCCULT BLOOD TEST: NEGATIVE

## 2016-07-15 ENCOUNTER — Telehealth: Payer: Self-pay | Admitting: Certified Nurse Midwife

## 2016-07-15 ENCOUNTER — Other Ambulatory Visit: Payer: Self-pay | Admitting: Certified Nurse Midwife

## 2016-07-15 DIAGNOSIS — R1900 Intra-abdominal and pelvic swelling, mass and lump, unspecified site: Secondary | ICD-10-CM

## 2016-07-15 NOTE — Telephone Encounter (Signed)
Talked with patient regarding recommended follow up for left adnexal mass. Discussed I reviewed PUS findings and feel she should follow up with another PUS as recommended by Dr. Quincy Simmonds. Discussed her history with Dr. Quincy Simmonds also. Patient feels she has be healed and this is not necessary. Discussed concerns if has not resolved and may need treatment. Questions addressed. After questions addressed, patient agreeable to schedule PUS per my recommendation.Order will be placed and patient will be called with insurance infor and scheduled. Patient appreciative to call and concern with her wellbeing.

## 2016-07-15 NOTE — Telephone Encounter (Signed)
Thank you for the update.  I have closed the encounter.  

## 2016-07-19 ENCOUNTER — Telehealth: Payer: Self-pay | Admitting: *Deleted

## 2016-07-19 NOTE — Telephone Encounter (Signed)
See telephone encounter dated 07/15/16, f/u with patient for scheduling PUS.   Spoke with patient. Patient scheduled by insurance/benefits department for PUS on 07/29/16 at 10am with consult to follow at 10:30am with Dr. Quincy Simmonds. Patient thankful for f/u call and is agreeable.   Routing to provider for final review. Patient is agreeable to disposition. Will close encounter.  Cc: Dr. Quincy Simmonds

## 2016-07-29 ENCOUNTER — Other Ambulatory Visit: Payer: Self-pay

## 2016-07-29 ENCOUNTER — Ambulatory Visit (INDEPENDENT_AMBULATORY_CARE_PROVIDER_SITE_OTHER): Payer: BLUE CROSS/BLUE SHIELD | Admitting: Obstetrics and Gynecology

## 2016-07-29 ENCOUNTER — Ambulatory Visit (INDEPENDENT_AMBULATORY_CARE_PROVIDER_SITE_OTHER): Payer: BLUE CROSS/BLUE SHIELD

## 2016-07-29 ENCOUNTER — Other Ambulatory Visit: Payer: BLUE CROSS/BLUE SHIELD | Admitting: Obstetrics and Gynecology

## 2016-07-29 ENCOUNTER — Encounter: Payer: Self-pay | Admitting: Obstetrics and Gynecology

## 2016-07-29 ENCOUNTER — Other Ambulatory Visit: Payer: BLUE CROSS/BLUE SHIELD

## 2016-07-29 VITALS — BP 110/76 | HR 66 | Ht 65.75 in | Wt 125.0 lb

## 2016-07-29 DIAGNOSIS — D259 Leiomyoma of uterus, unspecified: Secondary | ICD-10-CM

## 2016-07-29 DIAGNOSIS — R1900 Intra-abdominal and pelvic swelling, mass and lump, unspecified site: Secondary | ICD-10-CM

## 2016-07-29 NOTE — Progress Notes (Signed)
Patient ID: Karina Richards, female   DOB: 1966-01-01, 51 y.o.   MRN: [id number] GYNECOLOGY  VISIT   HPI: 51 y.o.   Married  Caucasian  female   [id number] with Patient's last menstrual period was 09/08/2014.   here for pelvic ultrasound to follow up on left adnexal mass.    States she feels good.  No menses.  Trying to put a balance in her life.   Last pelvic ultrasound on 02/13/15: Uterus - 1.2 mm fibroid. EMS - 5.22 mm.  Ovaries - normal.  Solid left adnexal mass 14 x 16 mm, much decreased in size, no abnormal blood flow. Free fluid - no  CA125 of 6 on 02/13/15.  Has known cervical stenosis.  GYNECOLOGIC HISTORY: Patient's last menstrual period was 09/08/2014. Contraception:  Postmenopausal Menopausal hormone therapy:  none Last mammogram: 06-28-16 Density C/Neg/BiRads1:[facility] [place] Last pap smear: 06-09-16 Neg; 06-03-14 Neg:Neg HR HPV        OB History    Gravida Para Term Preterm AB Living   3 2 0 2 1 2    SAB TAB Ectopic Multiple Live Births   1 0 0 0 2         Patient Active Problem List   Diagnosis Date Noted  . Ischemic cardiomyopathy 11/27/2013  . History of oral aphthous ulcers 03/19/2013  . Coronary atherosclerosis of native coronary artery 03/19/2013  . Hyperlipidemia 01/04/2013    Class: Chronic  . Chronic diastolic heart failure ([place]) 01/04/2013    Class: Acute  . Old MI (myocardial infarction) 12/31/2012    Class: Acute    Past Medical History:  Diagnosis Date  . Adnexal mass 2016   resolved by ultrasound 07/29/16.  Marland Kitchen CHF (congestive heart failure) ([place])   . Coronary artery disease   . Herpes simplex type 1 infection   . Myocardial infarction [facility])     Past Surgical History:  Procedure Laterality Date  . INGUINAL HERNIA REPAIR Right 2008  . LEFT HEART CATHETERIZATION WITH CORONARY ANGIOGRAM N/A 12/31/2012   LAD DES    Current Outpatient Prescriptions  Medication Sig Dispense Refill  . aspirin EC 81 MG EC tablet Take 1 tablet  (81 mg total) by mouth daily.    . Cholecalciferol LIQD Take 4,000 Units by mouth daily. Uses one drop on the tongue each morning    . Milk Thistle 250 MG CAPS Take 250 mg by mouth daily.    . nitroGLYCERIN (NITROSTAT) 0.4 MG SL tablet Place 1 tablet (0.4 mg total) under the tongue every 5 (five) minutes x 3 doses as needed for chest pain. 25 tablet 12  . NON FORMULARY Take 1 tablet by mouth daily. Reported on 06/06/2015     No current facility-administered medications for this visit.      ALLERGIES: Patient has no known allergies.  Family History  Problem Relation Age of Onset  . Heart disease Father   . Osteoporosis Mother   . Heart disease Mother   . COPD Mother   . Heart attack Maternal Grandmother     Social History   Social History  . Marital status: Married    Spouse name: N/A  . Number of children: N/A  . Years of education: N/A   Occupational History  . Not on file.   Social History Main Topics  . Smoking status: Never Smoker  . Smokeless tobacco: Never Used  . Alcohol use 1.2 oz/week    2 Standard drinks or equivalent per week  . Drug  use: No  . Sexual activity: Yes    Partners: Male    Birth control/ protection: Post-menopausal   Other Topics Concern  . Not on file   Social History Narrative  . No narrative on file    ROS:  Pertinent items are noted in HPI.  PHYSICAL EXAMINATION:    BP 110/76 (BP Location: Right Arm, Patient Position: Sitting, Cuff Size: Normal)   Pulse 66   Ht 5' 5.75" (1.67 m)   Wt 125 lb (56.7 kg)   LMP 09/08/2014   BMI 20.33 kg/m     General appearance: alert, cooperative and appears stated age   Pelvic ultrasound:  Uterus with small fibroid 12 mm.  EMS 3.19 mm.  Right ovary with follicle.  Left ovary without mass. No free fluid.  ASSESSMENT  Hx solid left ovarian mass, resolved.  Cervical stenosis.  Small fibroid.  Hx MI.   PLAN  Brief discussion of fibroid, it's benign nature and life history in menopause.   Reassurance regarding resolution of the left adnexal mass.  No further routine ultrasounds needed.  Call for any vaginal bleeding.  Follow up for annual exams and prn.    An After Visit Summary was printed and given to the patient.  __15____ minutes face to face time of which over 50% was spent in counseling.

## 2016-07-29 NOTE — Progress Notes (Signed)
Encounter reviewed by Dr. Brook Amundson C. Silva.  

## 2017-03-23 IMAGING — CT CT ABD-PELV W/ CM
1 of 3 series · 14 of 32 positions shown, 19 images · IV contrast (OMNIPAQUE)
Comparison: None.

CLINICAL DATA: Possible left ovarian mass

EXAM:
CT ABDOMEN AND PELVIS WITH CONTRAST
TECHNIQUE: Multidetector CT imaging of the abdomen and pelvis was performed
using the standard protocol following bolus administration of
intravenous contrast.
CONTRAST:  100mL OMNIPAQUE IOHEXOL 300 MG/ML  SOLN

[Series 2: routine abdomen/pelvis with · axial · 0.71mm/px · z∈[+557,+937]mm · 14 of 86 slices shown, 19 images]
[im 5/86  soft-tissue]
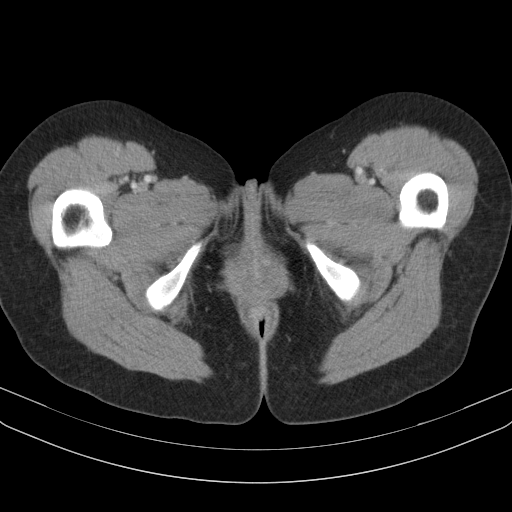
[im 5/86  bone]
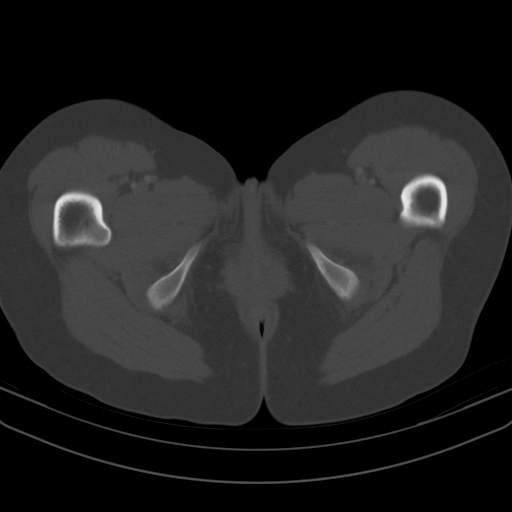
[im 14/86  soft-tissue]
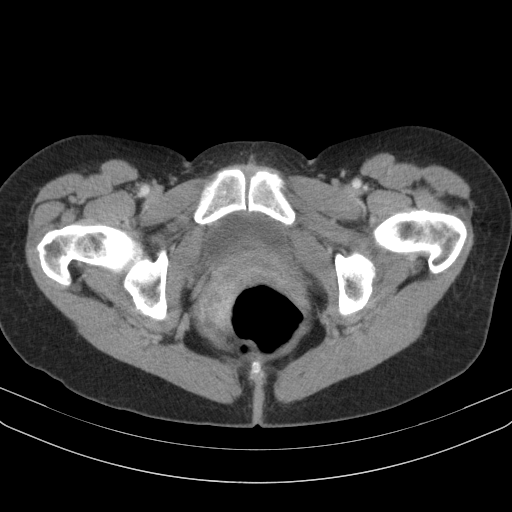
[im 18/86  soft-tissue]
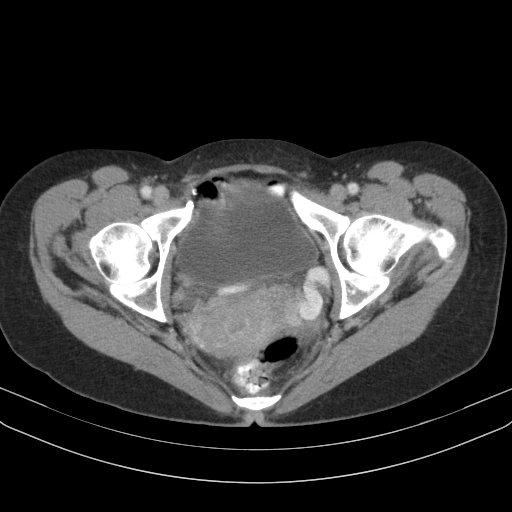
[im 23/86  soft-tissue]
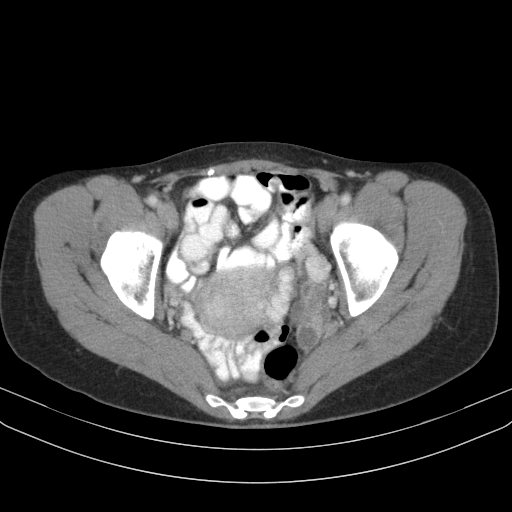
[im 32/86  soft-tissue]
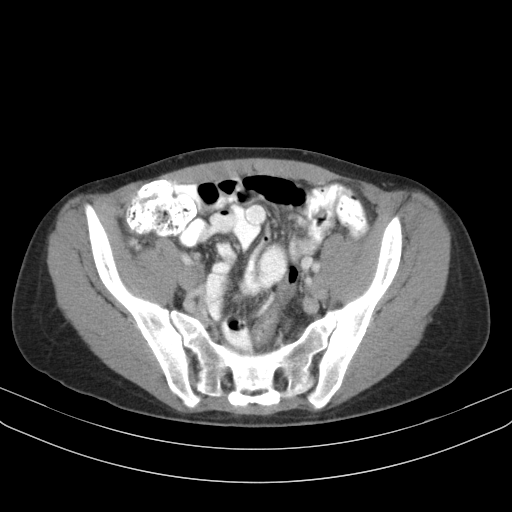
[im 36/86  soft-tissue]
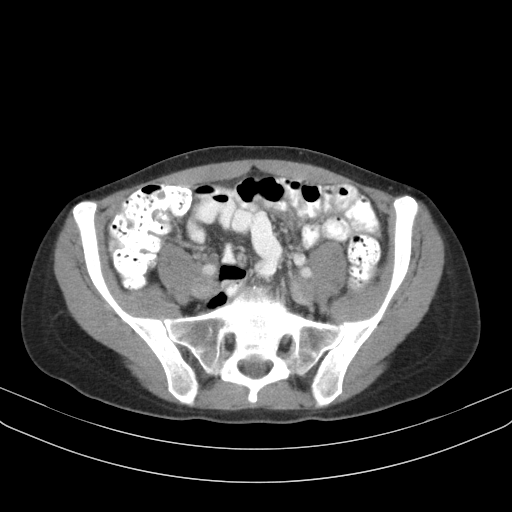
[im 45/86  soft-tissue]
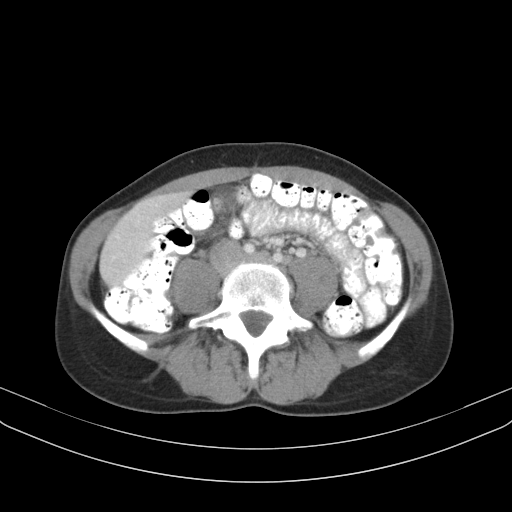
[im 50/86  soft-tissue]
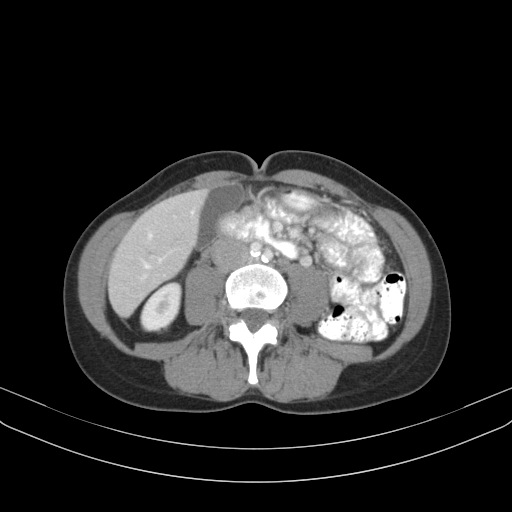
[im 54/86  soft-tissue]
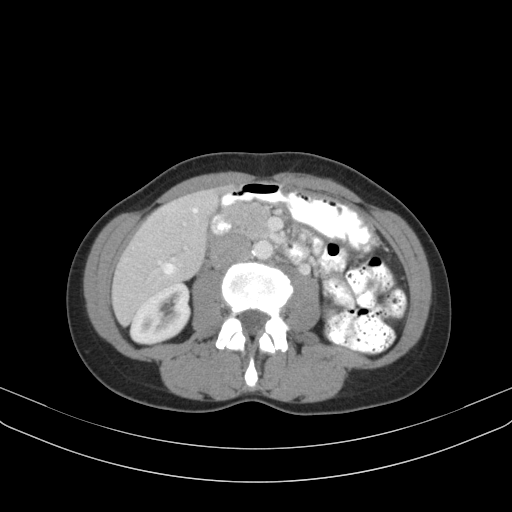
[im 54/86  bone]
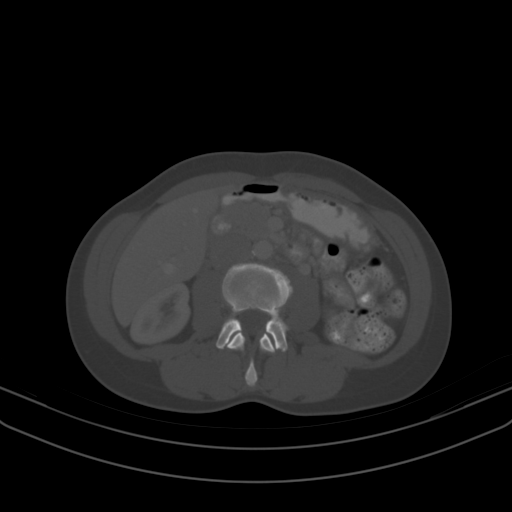
[im 63/86  soft-tissue]
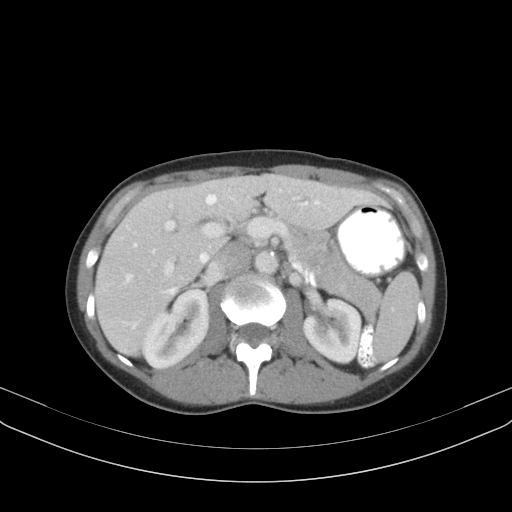
[im 68/86  soft-tissue]
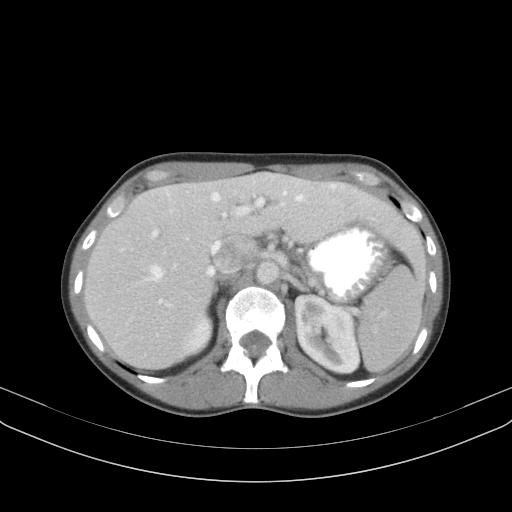
[im 68/86  lung]
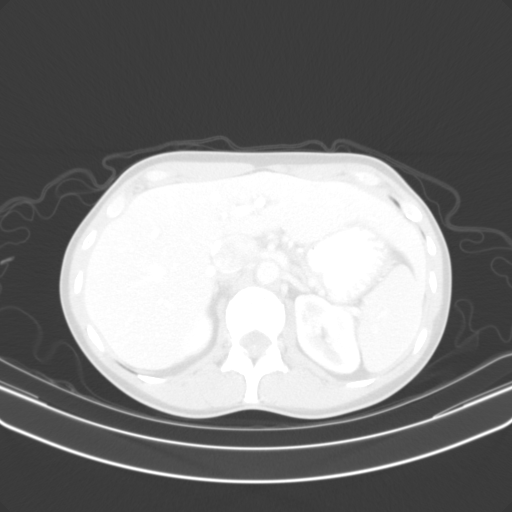
[im 72/86  soft-tissue]
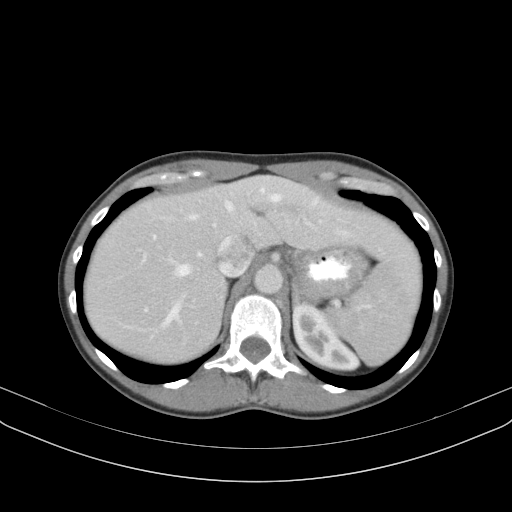
[im 72/86  lung]
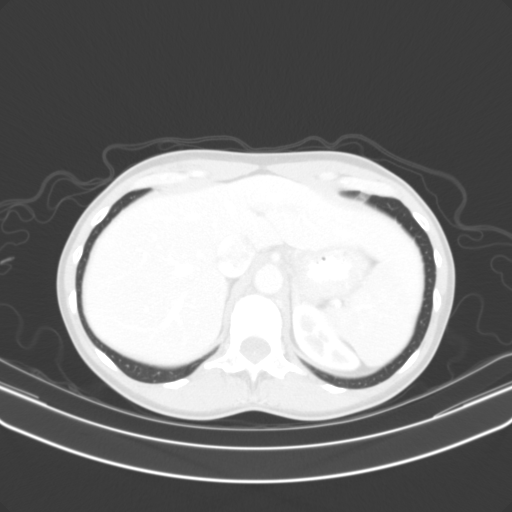
[im 77/86  lung]
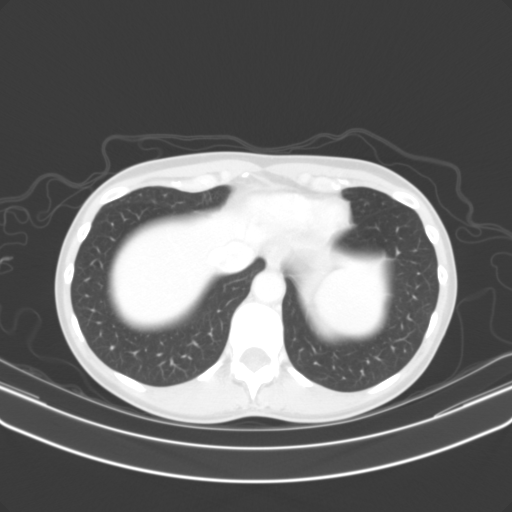
[im 81/86  soft-tissue]
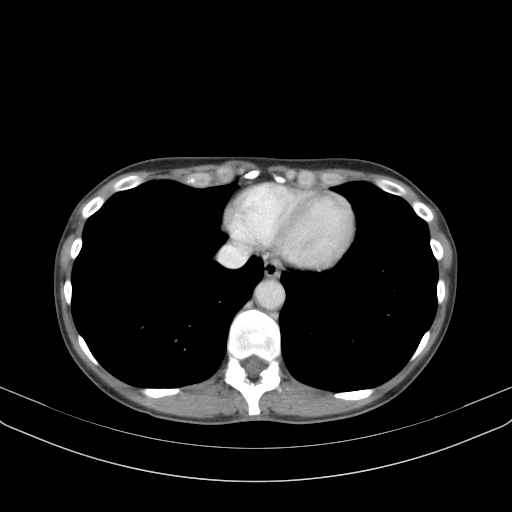
[im 81/86  lung]
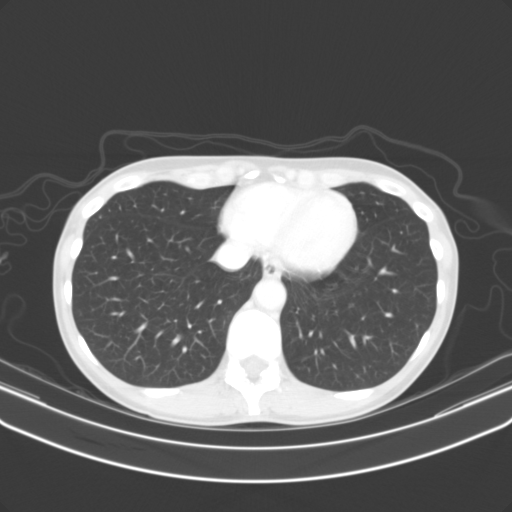

[14 of 32 positions shown; findings below may reference images not displayed]

FINDINGS: Lower chest:  Lung bases are clear.

Hepatobiliary: 4 mm probable cyst in the right hepatic dome (series
2/ image 11). Liver is otherwise within normal limits. No
suspicious/ enhancing hepatic lesions.

Gallbladder is unremarkable. No intrahepatic or extrahepatic ductal
dilatation.

Pancreas: Within normal limits.

Spleen: Within normal limits.

Adrenals/Urinary Tract: Adrenal glands are within normal limits.

Kidneys within normal limits.  No hydronephrosis.

Bladder is within normal limits.

Stomach/Bowel: Stomach is within normal limits.

No evidence of bowel obstruction.

Normal appendix.

Vascular/Lymphatic: Atherosclerotic calcifications of the abdominal
aorta and branch vessels.

No suspicious abdominopelvic lymphadenopathy.

Reproductive: Heterogeneous enhancement of the uterus, poorly
evaluated on CT.

Right ovary is unremarkable.

Left ovary is notable for a 1.6 cm simple appearing cyst/follicle
and additional mildly prominent cystic/tubular structures which
could reflect smaller cysts or possibly a dilated fallopian tube
(series 2/image 66).

Other: Trace pelvic ascites, likely physiologic.

Musculoskeletal: Visualized osseous structures are within normal
limits.
IMPRESSION: 1.6 cm simple left ovarian cyst/ follicle. Additional smaller left
ovarian cysts/follicle or less likely a left hydrosalpinx. No
dominant/suspicious left ovarian mass or tubo-ovarian abscess.

Mildly heterogeneous enhancement of the uterus, poorly evaluated on
CT.

Trace pelvic ascites, physiologic.

## 2017-06-15 ENCOUNTER — Encounter: Payer: Self-pay | Admitting: Certified Nurse Midwife

## 2017-06-15 ENCOUNTER — Ambulatory Visit: Payer: BLUE CROSS/BLUE SHIELD | Admitting: Certified Nurse Midwife

## 2017-06-15 ENCOUNTER — Other Ambulatory Visit: Payer: Self-pay

## 2017-06-15 VITALS — BP 108/68 | HR 68 | Resp 16 | Ht 65.75 in | Wt 125.0 lb

## 2017-06-15 DIAGNOSIS — Z1211 Encounter for screening for malignant neoplasm of colon: Secondary | ICD-10-CM

## 2017-06-15 DIAGNOSIS — Z862 Personal history of diseases of the blood and blood-forming organs and certain disorders involving the immune mechanism: Secondary | ICD-10-CM

## 2017-06-15 DIAGNOSIS — Z8639 Personal history of other endocrine, nutritional and metabolic disease: Secondary | ICD-10-CM

## 2017-06-15 DIAGNOSIS — Z01419 Encounter for gynecological examination (general) (routine) without abnormal findings: Secondary | ICD-10-CM

## 2017-06-15 DIAGNOSIS — N951 Menopausal and female climacteric states: Secondary | ICD-10-CM

## 2017-06-15 DIAGNOSIS — Z0000 Encounter for general adult medical examination without abnormal findings: Secondary | ICD-10-CM

## 2017-06-15 NOTE — Progress Notes (Signed)
52 y.o. [id number] Married  Caucasian Fe here for annual exam. Menopausal no HRT. Denies vaginal bleeding or vaginal dryness. Sees Dr. Quintin Alto PCP prn. Was released from cardiology and feels well. Takes Aspirin daily only. Still working, but second shift with [employer] still. This allows her to have more time to rest in am and eat normally before work. 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 Screening labs today. Considering colonoscopy now if recommended again. Social stress with son(23) being diagnosed with testicular cancer in the past 2 months. Prognosis is good so far!( I delivered him). Patient feels they all are coping well. She is eating normally and drinking her herbal teas for cleansing. Donates blood routinely. No other health concerns today.  Patient's last menstrual period was 09/08/2014.          Sexually active: Yes.    The current method of family planning is post menopausal status.    Exercising: Yes.    canoe, hike & bike Smoker:  no  Health Maintenance: Pap:  06-03-14 neg HPV HR neg, 06-09-16 neg History of Abnormal Pap: no MMG:  06-28-16 category c density birads 1:neg Self Breast exams: yes Colonoscopy:  None, BMD:   none TDaP:  2010 Shingles: no Pneumonia: no Hep C and HIV: hep c neg 2018 Labs: yes   reports that  has never smoked. she has never used smokeless tobacco. She reports that she drinks about 2.4 oz of alcohol per week. She reports that she does not use drugs.  Past Medical History:  Diagnosis Date  . Adnexal mass 2016   resolved by ultrasound 07/29/16.  Marland Kitchen CHF (congestive heart failure) ([place])   . Coronary artery disease   . Herpes simplex type 1 infection   . Myocardial infarction [facility])     Past Surgical History:  Procedure Laterality Date  . INGUINAL HERNIA REPAIR Right 2008  . LEFT HEART CATHETERIZATION WITH CORONARY ANGIOGRAM N/A 12/31/2012   LAD DES    Current Outpatient Medications  Medication Sig Dispense Refill  . Ascorbic  Acid (VITAMIN C PO) Take by mouth.    Marland Kitchen aspirin EC 81 MG EC tablet Take 1 tablet (81 mg total) by mouth daily.    . Cholecalciferol LIQD Take 4,000 Units by mouth daily. Uses one drop on the tongue each morning    . Milk Thistle 250 MG CAPS Take 250 mg by mouth daily.     No current facility-administered medications for this visit.     Family History  Problem Relation Age of Onset  . Heart disease Father   . Osteoporosis Mother   . Heart disease Mother   . COPD Mother   . Heart attack Maternal Grandmother   . Testicular cancer Son     ROS:  Pertinent items are noted in HPI.  Otherwise, a comprehensive ROS was negative.  Exam:   BP 108/68   Pulse 68   Resp 16   Ht 5' 5.75" (1.67 m)   Wt 125 lb (56.7 kg)   LMP 09/08/2014   BMI 20.33 kg/m  Height: 5' 5.75" (167 cm) Ht Readings from Last 3 Encounters:  06/15/17 5' 5.75" (1.67 m)  07/29/16 5' 5.75" (1.67 m)  06/09/16 5' 5.75" (1.67 m)    General appearance: alert, cooperative and appears stated age Head: Normocephalic, without obvious abnormality, atraumatic Neck: no adenopathy, supple, symmetrical, trachea midline and thyroid normal to inspection and palpation Lungs: clear to auscultation bilaterally Breasts: normal appearance, no masses or  tenderness, No nipple retraction or dimpling, No nipple discharge or bleeding, No axillary or supraclavicular adenopathy Heart: regular rate and rhythm Abdomen: soft, non-tender; no masses,  no organomegaly Extremities: extremities normal, atraumatic, no cyanosis or edema Skin: Skin color, texture, turgor normal. No rashes or lesions Lymph nodes: Cervical, supraclavicular, and axillary nodes normal. No abnormal inguinal nodes palpated Neurologic: Grossly normal   Pelvic: External genitalia:  no lesions              Urethra:  normal appearing urethra with no masses, tenderness or lesions              Bartholin's and Skene's: normal                 Vagina: normal appearing vagina with  normal color and discharge, no lesions              Cervix: multiparous appearance, no cervical motion tenderness and no lesions              Pap taken: No. Bimanual Exam:  Uterus:  normal size, contour, position, consistency, mobility, non-tender and anteflexed              Adnexa: normal adnexa and no mass, fullness, tenderness               Rectovaginal: Confirms               Anus:  normal sphincter tone, no lesions  Chaperone present: yes  A:  Well Woman with normal exam  Menopausal no HRT  Vaginal dryness with OTC moisture use with no issues  History of MI out of cardiology follow up now  Screening labs needed  History of elevated cholesterol will not taken medication  Social stress with son's cancer diagnosis  Colonoscopy due requests referral  P:   Reviewed health and wellness pertinent to exam  Discussed need to advise if vaginal bleeding.   Discussed coconut oil use and instructions if decides to try.  Discussed need to see PCP yearly for labs and follow up with previous heart issues. Patient does not plan to see this year, with labs here this year and will see PCP next year. If cholesterol is elevated will need to see cardiology or PCP for management. Patient agreeable.  Discussed seeking friends and family support for son and herself.  Discussed risks and benefits of colonoscopy, requests referral to Dr. Collene Mares. Patient will be call with information .  Labs: CMP,Lipid panel, Vitamin D, TSH, CBC  Pap smear: no   counseled on breast self exam, mammography screening, feminine hygiene, adequate intake of calcium and vitamin D, diet and exercise  return annually or prn  An After Visit Summary was printed and given to the patient.

## 2017-06-15 NOTE — Patient Instructions (Signed)

## 2017-06-16 ENCOUNTER — Telehealth: Payer: Self-pay | Admitting: *Deleted

## 2017-06-16 LAB — TSH: TSH: 1.25 u[IU]/mL (ref 0.450–4.500)

## 2017-06-16 LAB — COMPREHENSIVE METABOLIC PANEL
ALK PHOS: 74 IU/L (ref 39–117)
ALT: 25 IU/L (ref 0–32)
AST: 32 IU/L (ref 0–40)
Albumin/Globulin Ratio: 1.9 (ref 1.2–2.2)
Albumin: 4.9 g/dL (ref 3.5–5.5)
BUN/Creatinine Ratio: 11 (ref 9–23)
BUN: 9 mg/dL (ref 6–24)
Bilirubin Total: 0.8 mg/dL (ref 0.0–1.2)
CO2: 21 mmol/L (ref 20–29)
CREATININE: 0.83 mg/dL (ref 0.57–1.00)
Calcium: 9.4 mg/dL (ref 8.7–10.2)
Chloride: 101 mmol/L (ref 96–106)
GFR calc Af Amer: 92 mL/min/{1.73_m2} (ref >59)
GFR calc non Af Amer: 80 mL/min/{1.73_m2} (ref >59)
Globulin, Total: 2.6 g/dL (ref 1.5–4.5)
Glucose: 89 mg/dL (ref 65–99)
Potassium: 4.4 mmol/L (ref 3.5–5.2)
Sodium: 138 mmol/L (ref 134–144)
Total Protein: 7.5 g/dL (ref 6.0–8.5)

## 2017-06-16 LAB — CBC
HEMATOCRIT: 37.7 % (ref 34.0–46.6)
Hemoglobin: 11.4 g/dL (ref 11.1–15.9)
MCH: 22.3 pg — ABNORMAL LOW (ref 26.6–33.0)
MCHC: 30.2 g/dL — ABNORMAL LOW (ref 31.5–35.7)
MCV: 74 fL — AB (ref 79–97)
PLATELETS: 275 10*3/uL (ref 150–379)
RBC: 5.12 x10E6/uL (ref 3.77–5.28)
RDW: 15.9 % — ABNORMAL HIGH (ref 12.3–15.4)
WBC: 4.9 10*3/uL (ref 3.4–10.8)

## 2017-06-16 LAB — VITAMIN D 25 HYDROXY (VIT D DEFICIENCY, FRACTURES): Vit D, 25-Hydroxy: 68.4 ng/mL (ref 30.0–100.0)

## 2017-06-16 LAB — LIPID PANEL
Chol/HDL Ratio: 3.7 ratio (ref 0.0–4.4)
Cholesterol, Total: 275 mg/dL — ABNORMAL HIGH (ref 100–199)
HDL: 74 mg/dL (ref >39)
LDL CALC: 191 mg/dL — AB (ref 0–99)
Triglycerides: 48 mg/dL (ref 0–149)
VLDL CHOLESTEROL CAL: 10 mg/dL (ref 5–40)

## 2017-06-16 NOTE — Telephone Encounter (Signed)
Notes recorded by Burnice Logan, RN on 06/16/2017 at 2:44 PM EDT Left message to call Sharee Pimple at [phone].

## 2017-06-16 NOTE — Telephone Encounter (Signed)
-----   Message from Nunzio Cobbs, MD sent at 06/16/2017  8:23 AM EDT ----- This is Dr. Quincy Simmonds reviewing [facility] labs. Please contact patient with results.  She has elevated cholesterol and has already had a myocardial infarction.  I strongly recommend she follow up with cardiology.  Please assist in making an appointment.   Her metabolic profile, TSH, and vit D were normal.   I will ask for the lab to add iron and ferritin to her labs.  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

## 2017-06-16 NOTE — Telephone Encounter (Signed)
Spoke with patient, advised as seen below per Dr. Quincy Simmonds. Patient states she sees Dr. Mallie Mussel at [facility], declines assistance with scheduling, patient request to call directly to schedule. Advised will return call once remaining labs have returned and have been reviewed. Patient verbalizes understanding.   Routing to provider for final review. Patient is agreeable to disposition. Will close encounter.   Cc: Melvia Heaps, CNM

## 2017-06-21 ENCOUNTER — Other Ambulatory Visit: Payer: Self-pay | Admitting: Certified Nurse Midwife

## 2017-06-21 DIAGNOSIS — D508 Other iron deficiency anemias: Secondary | ICD-10-CM

## 2017-06-21 LAB — SPECIMEN STATUS REPORT

## 2017-06-21 LAB — FERRITIN: Ferritin: 11 ng/mL — ABNORMAL LOW (ref 15–150)

## 2017-06-21 LAB — IRON: IRON: 19 ug/dL — AB (ref 27–159)

## 2017-06-22 ENCOUNTER — Telehealth: Payer: Self-pay | Admitting: *Deleted

## 2017-06-22 NOTE — Telephone Encounter (Signed)
06/15/17 Lipid panel results faxed to cardiology, Dr. Daneen Schick, via Epic.

## 2017-06-22 NOTE — Telephone Encounter (Signed)
Spoke with patient, advised as seen below per Melvia Heaps, CNM. Lab appt scheduled for 5/16 at 11am.   Patients states she has not scheduled f/u with cardiologist. States she is "trying to handle this on her own with diet changes, all they will recommend is Lipitor, trying to avoid medications". Declined assistance with scheduling. Patient asking if lipid panel can be rechecked at [facility] in a few months, advised patient to f/u with cardiology for recheck and further evaluation for management of cholesterol.  Advised of importance given her cardiac hx. Patient states she is working on scheduling MMG and is scheduled for colonoscopy consult 3/28.  Advised to return call to office if assistance is needed.   Routing to provider for final review. Patient is agreeable to disposition. Will close encounter.

## 2017-06-22 NOTE — Telephone Encounter (Signed)
Notes recorded by Burnice Logan, RN on 06/22/2017 at 3:05 PM EDT Left message to call Sharee Pimple at [phone].  ------  Notes recorded by Regina Eck, CNM on 06/21/2017 at 12:25 PM EDT Notify patient that her Iron screening shows low iron at 19 normal is 27-159 Ferritin also low at 11 normal is 15-150  She needs to start on OTC timed release iron one daily and recheck in 8 weeks.( order placed) Can not donate blood at this point. Has she called cardiology regarding her labs and scheduled appointment. The cholesterol elevation increases her cardiac risk.

## 2017-06-24 ENCOUNTER — Telehealth: Payer: Self-pay | Admitting: *Deleted

## 2017-06-24 NOTE — Telephone Encounter (Signed)
   [facility] Pre-operative Risk Assessment    Request for surgical clearance:  1. What type of surgery is being performed? EGD AND COLONOSCOPY  When is this surgery scheduled? 07/04/17 2. What type of clearance is required (medical clearance vs. Pharmacy clearance to hold med vs. Both)? MEDICAL  3. Are there any medications that need to be held prior to surgery and how long? ASA  4. Practice name and name of physician performing surgery? [facility]   5. What is your office phone and fax number? [phone] 6. [place] NUMBER [phone]  7. Anesthesia type (None, local, MAC, general) ?  PROPOFOL  Devra Dopp 06/24/2017, 12:31 PM  _________________________________________________________________   (provider comments below)

## 2017-06-26 NOTE — Telephone Encounter (Signed)
Agree she needs indefinite Statin therapy.Lipitor 40-80 mg or Crestor 20-40 mg with repeat lipid analysis in 6-8 weeks

## 2017-06-29 ENCOUNTER — Telehealth: Payer: Self-pay | Admitting: Interventional Cardiology

## 2017-06-29 ENCOUNTER — Telehealth: Payer: Self-pay | Admitting: *Deleted

## 2017-06-29 NOTE — Telephone Encounter (Signed)
   Primary Cardiologist:Henry Nicholes Stairs III, MD  Chart reviewed as part of pre-operative protocol coverage. Because of Karina Richards past medical history and time since last visit (last seen 12/2014), he/she will require a follow-up visit in order to better assess preoperative cardiovascular risk and re-establish care.  Would not plan to hold ASA for egd/colonoscopy.  Pre-op covering staff: - Please schedule appointment and call patient to inform them. - Please contact requesting surgeon's office via preferred method (i.e, phone, fax) to inform them of need for appointment prior to surgery.  Murray Hodgkins, NP  06/29/2017, 4:09 PM

## 2017-06-29 NOTE — Telephone Encounter (Signed)
Telephone encounter previously closed, will open new encounter, see encounter dated 06/29/17.

## 2017-06-29 NOTE — Telephone Encounter (Signed)
New Message:       [facility] Pre-operative Risk Assessment    Request for surgical clearance:  1. What type of surgery is being performed? Colonoscopy/ endoscopy  2. When is this surgery scheduled? 07/04/17  3. What type of clearance is required (medical clearance vs. Pharmacy clearance to hold med vs. Both)? Medical  4. Are there any medications that need to be held prior to surgery and how long? Aspirin  5. Practice name and name of physician performing surgery? [facility]  6. What is your office phone and fax number? [phone] fax, [phone] phone   7. Anesthesia type (None, local, MAC, general) ? Proposol   Janan Ridge 06/29/2017, 4:10 PM  _________________________________________________________________   (provider comments below)

## 2017-06-29 NOTE — Telephone Encounter (Signed)
Reviewed recommendations from her MD and can we call patient with recommendations that she be seen again and with note received from her MD with comment we can not manage this medication and follow up for her.

## 2017-06-29 NOTE — Telephone Encounter (Signed)
Left message to call Sharee Pimple at [phone].  See previous telephone encounter dated 06/22/17, advise of cardiology recommendations, needs to f/u with cardiology for evaluation and treatment.

## 2017-06-30 NOTE — Telephone Encounter (Signed)
Spoke with patient and notified of cardiologist's recommendations and she needed follow up with him. She states she has appointment with Dr.Henry Tamala Julian on 07-29-17. Advised her to follow a low-cholesterol diet and increase exercise until her appointment. Routed to provider

## 2017-06-30 NOTE — Telephone Encounter (Signed)
Patient aware and has appt on 07/29/17 with Vin.  Campbell Soup and the office is aware and procedure is canceled.

## 2017-07-01 NOTE — Telephone Encounter (Addendum)
   Primary Cardiologist:Henry Nicholes Stairs III, MD  Chart reviewed as part of pre-operative protocol coverage. Because of Karina Richards past medical history and time since last visit, he/Karina Richards will require a follow-up visit in order to better assess preoperative cardiovascular risk.  Initial cardiac clearance was requested on 06/24/2017.  At that time, the chart was reviewed and it was noted that Karina Richards needed an appointment.  **Karina Richards has an appointment scheduled with Dr. Tamala Julian on 07/29/2017.  Karina Richards cannot give Karina Richards cardiac clearance until after that office visit.  Please reschedule the colonoscopy/endoscopy after that date.  Pre-op covering staff: - Please schedule appointment and call patient to inform them. - Please contact requesting surgeon's office via preferred method (i.e, phone, fax) to inform them of need for appointment prior to surgery.  Karina Ferries, PA-C  07/01/2017, 4:47 PM

## 2017-07-01 NOTE — Telephone Encounter (Signed)
Thank you :)

## 2017-07-04 NOTE — Telephone Encounter (Signed)
Called Patient .  Left detailed message for patient (DPR) to call back for appt for clearance

## 2017-07-04 NOTE — Telephone Encounter (Signed)
Forwarded to requesting office via EPIC

## 2017-07-05 NOTE — Telephone Encounter (Signed)
If she is not short of breath or having chest pain, it would be okay to proceed with colonoscopy.

## 2017-07-05 NOTE — Telephone Encounter (Signed)
Encounter closed

## 2017-07-06 NOTE — Telephone Encounter (Signed)
    Will route to [employer], PA-C who sees patient 07/29/17.   Richardson Dopp, PA-C    07/06/2017 4:53 PM

## 2017-07-29 ENCOUNTER — Encounter: Payer: Self-pay | Admitting: Physician Assistant

## 2017-07-29 ENCOUNTER — Ambulatory Visit: Payer: BLUE CROSS/BLUE SHIELD | Admitting: Physician Assistant

## 2017-07-29 VITALS — BP 120/78 | HR 66 | Ht 66.0 in | Wt 129.8 lb

## 2017-07-29 DIAGNOSIS — E785 Hyperlipidemia, unspecified: Secondary | ICD-10-CM

## 2017-07-29 DIAGNOSIS — I5032 Chronic diastolic (congestive) heart failure: Secondary | ICD-10-CM | POA: Diagnosis not present

## 2017-07-29 DIAGNOSIS — I2510 Atherosclerotic heart disease of native coronary artery without angina pectoris: Secondary | ICD-10-CM | POA: Diagnosis not present

## 2017-07-29 DIAGNOSIS — Z01818 Encounter for other preprocedural examination: Secondary | ICD-10-CM

## 2017-07-29 NOTE — Progress Notes (Signed)
Cardiology Office Note    Date:  07/29/2017   ID:  Karina Richards, DOB 1965-10-30, MRN [id number]  PCP:  Manon Hilding, MD  Cardiologist: Dr. Tamala Julian   Chief Complaint: Cardiac clearance  History of Present Illness:   Karina Richards is a 52 y.o. 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   Presented 12/2012 with Acute anteroapical myocardial infarction. Cath showed total occlusion of the distal half of the LAD. 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 Angioplasty and drug-eluting stent implantation was performed. Echo showed LVEF of 45-50% with grade 1 DD. Effient discontinued after 1 year of PCI. Since then he has done well on cardiac stand point.   Last seen by Dr.Smith 12/2014.  Here today for clearance. No complains. She does stretching and light exercise every morning. She does hiking, bicycling and kayaking once every few weeks without any limitation. She hiked 5 miles 2 weeks without any limitation. The patient denies nausea, vomiting, fever, chest pain, palpitations, shortness of breath, orthopnea, PND, dizziness, syncope, cough, congestion, abdominal pain, hematochezia, melena, lower extremity edema.    Past Medical History:  Diagnosis Date  . Adnexal mass 2016   resolved by ultrasound 07/29/16.  Marland Kitchen CHF (congestive heart failure) ([place])   . Coronary artery disease   . Herpes simplex type 1 infection   . Myocardial infarction [facility])     Past Surgical History:  Procedure Laterality Date  . INGUINAL HERNIA REPAIR Right 2008  . LEFT HEART CATHETERIZATION WITH CORONARY ANGIOGRAM N/A 12/31/2012   LAD DES    Current Medications: Prior to Admission medications   Medication Sig Start Date End Date Taking? Authorizing Provider  Ascorbic Acid (VITAMIN C PO) Take by mouth.    [provider]  aspirin EC 81 MG EC tablet Take 1 tablet (81 mg  total) by mouth daily. 01/04/13   Belva Crome, MD  Cholecalciferol LIQD Take 4,000 Units by mouth daily. Uses one drop on the tongue each morning    [provider]  Milk Thistle 250 MG CAPS Take 250 mg by mouth daily.    [provider]    Allergies:   Patient has no known allergies.   Social History   Socioeconomic History  . Marital status: Married    Spouse name: Not on file  . Number of children: Not on file  . Years of education: Not on file  . Highest education level: Not on file  Occupational History  . Not on file  Social Needs  . Financial resource strain: Not on file  . Food insecurity:    Worry: Not on file    Inability: Not on file  . Transportation needs:    Medical: Not on file    Non-medical: Not on file  Tobacco Use  . Smoking status: Never Smoker  . Smokeless tobacco: Never Used  Substance and Sexual Activity  . Alcohol use: Yes    Alcohol/week: 2.4 oz    Types: 4 Standard drinks or equivalent per week  . Drug use: No  . Sexual activity: Yes    Partners: Male    Birth control/protection: Post-menopausal  Lifestyle  . Physical activity:    Days per week: Not on file    Minutes per session: Not on file  . Stress: Not on file  Relationships  . Social connections:    Talks on phone:  Not on file    Gets together: Not on file    Attends religious service: Not on file    Active member of club or organization: Not on file    Attends meetings of clubs or organizations: Not on file    Relationship status: Not on file  Other Topics Concern  . Not on file  Social History Narrative  . Not on file     Family History:  The patient's family history includes COPD in her mother; Heart attack in her maternal grandmother; Heart disease in her father and mother; Osteoporosis in her mother; Testicular cancer in her son.   ROS:   Please see the history of present illness.    ROS All other systems reviewed and are negative.   PHYSICAL EXAM:    VS:  BP 120/78   Pulse 66   Ht 5\' 6"  (1.676 m)   Wt 129 lb 12.8 oz (58.9 kg)   LMP 09/08/2014   BMI 20.95 kg/m    GEN: Well nourished, well developed, in no acute distress  HEENT: normal  Neck: no JVD, carotid bruits, or masses Cardiac: RRR; no murmurs, rubs, or gallops,no edema  Respiratory:  clear to auscultation bilaterally, normal work of breathing GI: soft, nontender, nondistended, + BS MS: no deformity or atrophy  Skin: warm and dry, no rash Neuro:  Alert and Oriented x 3, Strength and sensation are intact Psych: euthymic mood, full affect  Wt Readings from Last 3 Encounters:  07/29/17 129 lb 12.8 oz (58.9 kg)  06/15/17 125 lb (56.7 kg)  07/29/16 125 lb (56.7 kg)      Studies/Labs Reviewed:   EKG:  EKG is ordered today.  The ekg ordered today demonstrates NSR Recent Labs: 06/15/2017: ALT 25; BUN 9; Creatinine, Ser 0.83; Hemoglobin 11.4; Platelets 275; Potassium 4.4; Sodium 138; TSH 1.250   Lipid Panel    Component Value Date/Time   CHOL 275 (H) 06/15/2017 1407   TRIG 48 06/15/2017 1407   HDL 74 06/15/2017 1407   CHOLHDL 3.7 06/15/2017 1407   CHOLHDL 4.8 06/09/2016 1200   VLDL 17 06/09/2016 1200   LDLCALC 191 (H) 06/15/2017 1407   LDLDIRECT 202.3 03/27/2013 1412    Additional studies/ records that were reviewed today include:   As above   ASSESSMENT & PLAN:    1. CAD with coronary dissection - Continue ASA. No angina of dyspnea.  2. Chronic diastolic CHF - Euvolemic. Symptomatic.   3. HLD -06/15/2017: Cholesterol, Total 275; HDL 74; LDL Calculated 191; Triglycerides 48  - Discussed addition of statin as LDL goal of less than 70. She says "working on diet". Used to eat lots of cheese. She has follow up with PCP.   4. Pre-op clearance Given past medical history and time since last visit, based on ACC/AHA guidelines, [name] would be at acceptable risk for the planned procedure without further cardiovascular testing. Ok to hold ASA 5-7 days  prior to procedure.   I will route this recommendation to the requesting party via Epic fax function and remove from pre-op pool.  Please call with questions.  March ARB, [place] 07/29/2017, 3:50 PM      Medication Adjustments/Labs and Tests Ordered: Current medicines are reviewed at length with the patient today.  Concerns regarding medicines are outlined above.  Medication changes, Labs and Tests ordered today are listed in the Patient Instructions below. Patient Instructions  Medication Instructions:   Your physician recommends that you continue on your current medications as  directed. Please refer to the Current Medication list given to you today.   If you need a refill on your cardiac medications before your next appointment, please call your pharmacy.  Labwork:  NONE ORDERED  TODAY    Testing/Procedures:  NONE ORDERED  TODAY    Follow-Up:  Your physician wants you to follow-up in: [place] will receive a reminder letter in the mail two months in advance. If you don't receive a letter, please call our office to schedule the follow-up appointment.      Any Other Special Instructions Will Be Listed Below (If Applicable).                                                                                                                                                      Jarrett Soho, [place]  07/29/2017 3:48 PM    [facility] [place], [place], [place]  [address] Phone: [phone]; Fax: [phone]

## 2017-07-29 NOTE — Patient Instructions (Signed)
Medication Instructions:   Your physician recommends that you continue on your current medications as directed. Please refer to the Current Medication list given to you today.   If you need a refill on your cardiac medications before your next appointment, please call your pharmacy.  Labwork:  NONE ORDERED  TODAY    Testing/Procedures:  NONE ORDERED  TODAY    Follow-Up:  Your physician wants you to follow-up in: [place] will receive a reminder letter in the mail two months in advance. If you don't receive a letter, please call our office to schedule the follow-up appointment.      Any Other Special Instructions Will Be Listed Below (If Applicable).

## 2017-08-01 NOTE — Addendum Note (Signed)
Addended by: Mady Haagensen on: 08/01/2017 04:20 PM   Modules accepted: Orders

## 2017-08-08 ENCOUNTER — Encounter: Payer: Self-pay | Admitting: Certified Nurse Midwife

## 2017-08-08 ENCOUNTER — Other Ambulatory Visit: Payer: Self-pay | Admitting: Gastroenterology

## 2017-08-08 DIAGNOSIS — R933 Abnormal findings on diagnostic imaging of other parts of digestive tract: Secondary | ICD-10-CM

## 2017-08-11 ENCOUNTER — Other Ambulatory Visit (INDEPENDENT_AMBULATORY_CARE_PROVIDER_SITE_OTHER): Payer: BLUE CROSS/BLUE SHIELD

## 2017-08-11 DIAGNOSIS — D508 Other iron deficiency anemias: Secondary | ICD-10-CM

## 2017-08-12 ENCOUNTER — Ambulatory Visit
Admission: RE | Admit: 2017-08-12 | Discharge: 2017-08-12 | Disposition: A | Discharge status: Home / Self Care | Payer: BLUE CROSS/BLUE SHIELD | Source: Ambulatory Visit | Attending: Gastroenterology | Admitting: Gastroenterology

## 2017-08-12 ENCOUNTER — Other Ambulatory Visit: Payer: Self-pay | Admitting: Certified Nurse Midwife

## 2017-08-12 DIAGNOSIS — D500 Iron deficiency anemia secondary to blood loss (chronic): Secondary | ICD-10-CM

## 2017-08-12 DIAGNOSIS — R933 Abnormal findings on diagnostic imaging of other parts of digestive tract: Secondary | ICD-10-CM

## 2017-08-12 LAB — CBC
Hematocrit: 37.5 % (ref 34.0–46.6)
Hemoglobin: 11.7 g/dL (ref 11.1–15.9)
MCH: 23.7 pg — AB (ref 26.6–33.0)
MCHC: 31.2 g/dL — AB (ref 31.5–35.7)
MCV: 76 fL — ABNORMAL LOW (ref 79–97)
PLATELETS: 211 10*3/uL (ref 150–379)
RBC: 4.93 x10E6/uL (ref 3.77–5.28)
RDW: 19.2 % — AB (ref 12.3–15.4)
WBC: 4 10*3/uL (ref 3.4–10.8)

## 2017-08-12 LAB — IRON AND TIBC
IRON SATURATION: 10 % — AB (ref 15–55)
IRON: 43 ug/dL (ref 27–159)
TIBC: 422 ug/dL (ref 250–450)
UIBC: 379 ug/dL (ref 131–425)

## 2017-08-12 LAB — FERRITIN: Ferritin: 16 ng/mL (ref 15–150)

## 2017-08-12 MED ORDER — IOPAMIDOL (ISOVUE-300) INJECTION 61%
100.0000 mL | Freq: Once | INTRAVENOUS | Status: AC | PRN
  Administered 2017-08-12: 100 mL via INTRAVENOUS

## 2017-08-18 ENCOUNTER — Telehealth: Payer: Self-pay | Admitting: Certified Nurse Midwife

## 2017-08-18 NOTE — Telephone Encounter (Signed)
Routing to [place], CNM. Will close encounter.   Cc: Royal Hawthorn, CMA

## 2017-08-18 NOTE — Telephone Encounter (Signed)
Patient calling to let Joy know that she got her message. She is currently out of town and will schedule her appointment when she returns.

## 2017-09-07 ENCOUNTER — Emergency Department (HOSPITAL_COMMUNITY): Payer: BLUE CROSS/BLUE SHIELD

## 2017-09-07 ENCOUNTER — Encounter (HOSPITAL_COMMUNITY): Payer: Self-pay | Admitting: Emergency Medicine

## 2017-09-07 ENCOUNTER — Inpatient Hospital Stay (HOSPITAL_COMMUNITY)
Admission: EM | Admit: 2017-09-07 | Discharge: 2017-09-11 | DRG: 281 | Disposition: A | Discharge status: Home / Self Care | Payer: BLUE CROSS/BLUE SHIELD | Attending: Cardiovascular Disease | Admitting: Cardiovascular Disease

## 2017-09-07 ENCOUNTER — Other Ambulatory Visit: Payer: Self-pay

## 2017-09-07 DIAGNOSIS — I5032 Chronic diastolic (congestive) heart failure: Secondary | ICD-10-CM | POA: Diagnosis present

## 2017-09-07 DIAGNOSIS — I959 Hypotension, unspecified: Secondary | ICD-10-CM | POA: Diagnosis present

## 2017-09-07 DIAGNOSIS — Z825 Family history of asthma and other chronic lower respiratory diseases: Secondary | ICD-10-CM | POA: Diagnosis not present

## 2017-09-07 DIAGNOSIS — Z7982 Long term (current) use of aspirin: Secondary | ICD-10-CM | POA: Diagnosis not present

## 2017-09-07 DIAGNOSIS — R0789 Other chest pain: Secondary | ICD-10-CM | POA: Diagnosis present

## 2017-09-07 DIAGNOSIS — Z8043 Family history of malignant neoplasm of testis: Secondary | ICD-10-CM | POA: Diagnosis not present

## 2017-09-07 DIAGNOSIS — I2510 Atherosclerotic heart disease of native coronary artery without angina pectoris: Secondary | ICD-10-CM | POA: Diagnosis present

## 2017-09-07 DIAGNOSIS — E785 Hyperlipidemia, unspecified: Secondary | ICD-10-CM | POA: Diagnosis present

## 2017-09-07 DIAGNOSIS — I214 Non-ST elevation (NSTEMI) myocardial infarction: Secondary | ICD-10-CM | POA: Diagnosis present

## 2017-09-07 DIAGNOSIS — I255 Ischemic cardiomyopathy: Secondary | ICD-10-CM | POA: Diagnosis present

## 2017-09-07 DIAGNOSIS — Z8262 Family history of osteoporosis: Secondary | ICD-10-CM | POA: Diagnosis not present

## 2017-09-07 DIAGNOSIS — Z955 Presence of coronary angioplasty implant and graft: Secondary | ICD-10-CM | POA: Diagnosis not present

## 2017-09-07 DIAGNOSIS — I110 Hypertensive heart disease with heart failure: Secondary | ICD-10-CM | POA: Diagnosis present

## 2017-09-07 DIAGNOSIS — Z6379 Other stressful life events affecting family and household: Secondary | ICD-10-CM | POA: Diagnosis not present

## 2017-09-07 DIAGNOSIS — I252 Old myocardial infarction: Secondary | ICD-10-CM | POA: Diagnosis not present

## 2017-09-07 DIAGNOSIS — I249 Acute ischemic heart disease, unspecified: Secondary | ICD-10-CM | POA: Diagnosis present

## 2017-09-07 DIAGNOSIS — Z8249 Family history of ischemic heart disease and other diseases of the circulatory system: Secondary | ICD-10-CM | POA: Diagnosis not present

## 2017-09-07 DIAGNOSIS — Z79899 Other long term (current) drug therapy: Secondary | ICD-10-CM | POA: Diagnosis not present

## 2017-09-07 HISTORY — DX: Adverse effect of unspecified anesthetic, initial encounter: T41.45XA

## 2017-09-07 HISTORY — DX: Other complications of anesthesia, initial encounter: T88.59XA

## 2017-09-07 LAB — CBC
HCT: 44.9 % (ref 36.0–46.0)
HEMOGLOBIN: 14.8 g/dL (ref 12.0–15.0)
MCH: 25.2 pg — AB (ref 26.0–34.0)
MCHC: 33 g/dL (ref 30.0–36.0)
MCV: 76.5 fL — AB (ref 78.0–100.0)
Platelets: 235 10*3/uL (ref 150–400)
RBC: 5.87 MIL/uL — AB (ref 3.87–5.11)
RDW: 18.9 % — ABNORMAL HIGH (ref 11.5–15.5)
WBC: 11.1 10*3/uL — ABNORMAL HIGH (ref 4.0–10.5)

## 2017-09-07 LAB — PROTIME-INR
INR: 1.07
PROTHROMBIN TIME: 13.8 s (ref 11.4–15.2)

## 2017-09-07 LAB — BASIC METABOLIC PANEL
ANION GAP: 11 (ref 5–15)
BUN: 13 mg/dL (ref 6–20)
CHLORIDE: 97 mmol/L — AB (ref 101–111)
CO2: 26 mmol/L (ref 22–32)
Calcium: 10.1 mg/dL (ref 8.9–10.3)
Creatinine, Ser: 0.89 mg/dL (ref 0.44–1.00)
GFR calc Af Amer: >60 mL/min (ref >60)
GFR calc non Af Amer: >60 mL/min (ref >60)
Glucose, Bld: 103 mg/dL — ABNORMAL HIGH (ref 65–99)
Potassium: 4.1 mmol/L (ref 3.5–5.1)
Sodium: 134 mmol/L — ABNORMAL LOW (ref 135–145)

## 2017-09-07 LAB — I-STAT TROPONIN, ED: TROPONIN I, POC: 2.87 ng/mL — AB (ref 0.00–0.08)

## 2017-09-07 LAB — I-STAT BETA HCG BLOOD, ED (MC, WL, AP ONLY): HCG, QUANTITATIVE: 5.2 m[IU]/mL — AB (ref <5)

## 2017-09-07 LAB — APTT: aPTT: 33 seconds (ref 24–36)

## 2017-09-07 LAB — TROPONIN I: Troponin I: 4.91 ng/mL (ref <0.03)

## 2017-09-07 MED ORDER — HEPARIN BOLUS VIA INFUSION
3000.0000 [IU] | Freq: Once | INTRAVENOUS | Status: AC
  Administered 2017-09-07: 3000 [IU] via INTRAVENOUS

## 2017-09-07 MED ORDER — NITROGLYCERIN IN D5W 200-5 MCG/ML-% IV SOLN
0.0000 ug/min | INTRAVENOUS | Status: DC
  Administered 2017-09-07: 5 ug/min via INTRAVENOUS
  Administered 2017-09-08: 10 ug/min via INTRAVENOUS
  Filled 2017-09-07 (×2): qty 250

## 2017-09-07 MED ORDER — ASPIRIN 81 MG PO CHEW
324.0000 mg | CHEWABLE_TABLET | Freq: Once | ORAL | Status: AC
  Administered 2017-09-07: 324 mg via ORAL
  Filled 2017-09-07: qty 4

## 2017-09-07 MED ORDER — SODIUM CHLORIDE 0.9 % IV BOLUS
500.0000 mL | Freq: Once | INTRAVENOUS | Status: AC
  Administered 2017-09-07: 500 mL via INTRAVENOUS

## 2017-09-07 MED ORDER — HEPARIN (PORCINE) IN NACL 100-0.45 UNIT/ML-% IJ SOLN
750.0000 [IU]/h | INTRAMUSCULAR | Status: DC
  Administered 2017-09-07: 750 [IU]/h via INTRAVENOUS
  Filled 2017-09-07: qty 250

## 2017-09-07 MED ORDER — SODIUM CHLORIDE 0.9 % IV SOLN
INTRAVENOUS | Status: DC
  Administered 2017-09-07 – 2017-09-08 (×2): via INTRAVENOUS

## 2017-09-07 MED ORDER — MORPHINE SULFATE (PF) 4 MG/ML IV SOLN
4.0000 mg | Freq: Once | INTRAVENOUS | Status: AC
  Administered 2017-09-07: 4 mg via INTRAVENOUS
  Filled 2017-09-07: qty 1

## 2017-09-07 MED ORDER — NITROGLYCERIN 0.4 MG SL SUBL
0.4000 mg | SUBLINGUAL_TABLET | SUBLINGUAL | Status: DC | PRN
  Administered 2017-09-07: 0.4 mg via SUBLINGUAL
  Filled 2017-09-07: qty 1

## 2017-09-07 NOTE — ED Provider Notes (Addendum)
[facility] EMERGENCY DEPARTMENT Provider Note   CSN: [id number] Arrival date & time: 09/07/17  1919     History   Chief Complaint Chief Complaint  Patient presents with  . Chest Pain    HPI Karina Richards is a 52 y.o. female.  HPI Patient presents to the emergency room for evaluation of left-sided chest pain that started last evening.  Patient states her symptoms persisted throughout the day.  It is an aching and pressure-like discomfort.  She has also been feeling short of breath.  Any activity makes the symptoms worse.  Patient denies any diaphoresis or nausea.  Patient has a history of coronary artery disease, mild cardial infarction and a stent.  She does have history of hypertension but has not been taking blood pressure medications.  She does take an aspirin daily. Past Medical History:  Diagnosis Date  . Adnexal mass 2016   resolved by ultrasound 07/29/16.  Marland Kitchen CHF (congestive heart failure) ([place])   . Coronary artery disease   . Herpes simplex type 1 infection   . Myocardial infarction [facility])     Patient Active Problem List   Diagnosis Date Noted  . NSTEMI (non-ST elevated myocardial infarction) ([place]) 09/07/2017  . Ischemic cardiomyopathy 11/27/2013  . History of oral aphthous ulcers 03/19/2013  . Coronary atherosclerosis of native coronary artery 03/19/2013  . Hyperlipidemia 01/04/2013    Class: Chronic  . Chronic diastolic heart failure ([place]) 01/04/2013    Class: Acute  . Old MI (myocardial infarction) 12/31/2012    Class: Acute    Past Surgical History:  Procedure Laterality Date  . INGUINAL HERNIA REPAIR Right 2008  . LEFT HEART CATHETERIZATION WITH CORONARY ANGIOGRAM N/A 12/31/2012   LAD DES     OB History    Gravida  3   Para  2   Term  0   Preterm  2   AB  1   Living  2     SAB  1   TAB  0   Ectopic  0   Multiple  0   Live Births  2            Home Medications    Prior to Admission medications   Medication Sig Start Date  End Date Taking? Authorizing Provider  aspirin EC 81 MG EC tablet Take 1 tablet (81 mg total) by mouth daily. 01/04/13  Yes Belva Crome, MD  Cholecalciferol LIQD Take 4,000 Units by mouth daily. Uses one drop on the tongue each morning   Yes [provider]  Milk Thistle 250 MG CAPS Take 250 mg by mouth daily.   Yes [provider]  Multiple Vitamins-Minerals (EMERGEN-C VITAMIN C) PACK Take 1 Package by mouth daily.   Yes [provider]    Family History Family History  Problem Relation Age of Onset  . Heart disease Father   . Osteoporosis Mother   . Heart disease Mother   . COPD Mother   . Heart attack Maternal Grandmother   . Testicular cancer Son     Social History Social History   Tobacco Use  . Smoking status: Never Smoker  . Smokeless tobacco: Never Used  Substance Use Topics  . Alcohol use: Yes    Alcohol/week: 2.4 oz    Types: 4 Standard drinks or equivalent per week  . Drug use: No     Allergies   Patient has no known allergies.   Review of Systems Review of Systems  All other  systems reviewed and are negative.    Physical Exam Updated Vital Signs BP 126/86   Pulse 73   Temp 98.2 F (36.8 C) (Oral)   Resp 13   Ht 1.702 m (5\' 7" )   Wt 56.7 kg (125 lb)   LMP 09/08/2014   SpO2 99%   BMI 19.58 kg/m   Physical Exam  Constitutional: She appears well-developed and well-nourished. No distress.  HENT:  Head: Normocephalic and atraumatic.  Right Ear: External ear normal.  Left Ear: External ear normal.  Eyes: Conjunctivae are normal. Right eye exhibits no discharge. Left eye exhibits no discharge. No scleral icterus.  Neck: Neck supple. No tracheal deviation present.  Cardiovascular: Normal rate, regular rhythm and intact distal pulses.  Pulmonary/Chest: Effort normal and breath sounds normal. No stridor. No respiratory distress. She has no wheezes. She has no rales.  Abdominal: Soft. Bowel sounds are normal. She exhibits  no distension. There is no tenderness. There is no rebound and no guarding.  Musculoskeletal: She exhibits no edema or tenderness.  Neurological: She is alert. She has normal strength. No cranial nerve deficit (no facial droop, extraocular movements intact, no slurred speech) or sensory deficit. She exhibits normal muscle tone. She displays no seizure activity. Coordination normal.  Skin: Skin is warm and dry. No rash noted.  Psychiatric: She has a normal mood and affect.  Nursing note and vitals reviewed.    ED Treatments / Results  Labs (all labs ordered are listed, but only abnormal results are displayed) Labs Reviewed  BASIC METABOLIC PANEL - Abnormal; Notable for the following components:      Result Value   Sodium 134 (*)    Chloride 97 (*)    Glucose, Bld 103 (*)    All other components within normal limits  CBC - Abnormal; Notable for the following components:   WBC 11.1 (*)    RBC 5.87 (*)    MCV 76.5 (*)    MCH 25.2 (*)    RDW 18.9 (*)    All other components within normal limits  TROPONIN I - Abnormal; Notable for the following components:   Troponin I 4.91 (*)    All other components within normal limits  I-STAT TROPONIN, ED - Abnormal; Notable for the following components:   Troponin i, poc 2.87 (*)    All other components within normal limits  I-STAT BETA HCG BLOOD, ED ([facility], [facility], [facility] ONLY) - Abnormal; Notable for the following components:   I-stat hCG, quantitative 5.2 (*)    All other components within normal limits  APTT  PROTIME-INR  TROPONIN I  HEPARIN LEVEL (UNFRACTIONATED)  CBC    EKG EKG Interpretation  Date/Time:  Wednesday September 07 2017 19:26:11 EDT Ventricular Rate:  80 PR Interval:    QRS Duration: 91 QT Interval:  361 QTC Calculation: 417 R Axis:   -54 Text Interpretation:  Sinus rhythm Biatrial enlargement Left anterior fascicular block RSR' in V1 or V2, right VCD or RVH Borderline T abnormalities, anterior leads ST elevation, consider  inferior injury anterior st changes resolved compared to prior tracing Confirmed by Dorie Rank [phone]) on 09/07/2017 7:29:37 PM   Radiology Dg Chest Portable 1 View  Result Date: 09/07/2017 CLINICAL DATA:  Left-sided chest pain.  Shortness of breath. EXAM: PORTABLE CHEST 1 VIEW COMPARISON:  January 02, 2013 FINDINGS: Nipple shadows project over the lung bases. No pneumothorax. No suspicious nodules or masses. No focal infiltrates. The heart, hila, and mediastinum are normal. IMPRESSION: No active disease.  Electronically Signed   By: Dorise Bullion III M.D   On: 09/07/2017 20:06    Procedures .Critical Care Performed by: Dorie Rank, MD Authorized by: Dorie Rank, MD   Critical care provider statement:    Critical care time (minutes):  45   Critical care was time spent personally by me on the following activities:  Discussions with consultants, evaluation of patient's response to treatment, examination of patient, ordering and performing treatments and interventions, ordering and review of laboratory studies, ordering and review of radiographic studies, pulse oximetry, re-evaluation of patient's condition, obtaining history from patient or surrogate and review of old charts   (including critical care time)  Medications Ordered in ED Medications  0.9 %  sodium chloride infusion ( Intravenous New Bag/Given 09/07/17 2028)  nitroGLYCERIN (NITROSTAT) SL tablet 0.4 mg (0.4 mg Sublingual Given 09/07/17 2006)  nitroGLYCERIN 50 mg in dextrose 5 % 250 mL (0.2 mg/mL) infusion (5 mcg/min Intravenous New Bag/Given 09/07/17 2007)  heparin bolus via infusion 3,000 Units (3,000 Units Intravenous Bolus from Bag 09/07/17 2026)    Followed by  heparin ADULT infusion 100 units/mL (25000 units/226mL sodium chloride 0.45%) (750 Units/hr Intravenous New Bag/Given 09/07/17 2027)  sodium chloride 0.9 % bolus 500 mL (500 mLs Intravenous New Bag/Given 09/07/17 2043)  aspirin chewable tablet 324 mg (324 mg Oral Given 09/07/17  2002)  morphine 4 MG/ML injection 4 mg (4 mg Intravenous Given 09/07/17 2003)     Initial Impression / Assessment and Plan / ED Course  I have reviewed the triage vital signs and the nursing notes.  Pertinent labs & imaging results that were available during my care of the patient were reviewed by me and considered in my medical decision making (see chart for details).  Clinical Course as of Sep 07 2048  Wed Sep 07, 2017  2004 Patient's initial troponin is 2.87.  Her symptoms are concerning certainly for recurrent myocardial infarction.  She does have some EKG changes although not definitive for ST elevation MI.  Patient has been started on nitroglycerin I have ordered heparin.  I will consult cardiology.   [JK]  2014 D/w Dr. Erasmo Score, Cardiology.  Will transfer to [facility]   [JK]  2028 BP dropped with NTG.  Will hold infusion.  IV fluid bolus ordered.  Pt's pain has resolved.   [JK]  2050 Pt remains pain free.   BP has improved   [JK]    Clinical Course User Index [JK] Dorie Rank, MD    Patient presented to the emergency room for evaluation of chest pain that started yesterday.  Patient has a history of coronary artery disease status post myocardial infarction.  Patient also has a history of hypertension but has not been compliant with antihypertensive medications.  She has concerning EKG changes.  Her troponin is elevated at 2.87.  This is concerning for non-ST elevation MI.  Patient was started on aspirin, nitroglycerin and heparin.  Nitro has been held because of her hypotension.  Patient will be transferred down to [facility] for further treatment.  Final Clinical Impressions(s) / ED Diagnoses   Final diagnoses:  NSTEMI (non-ST elevated myocardial infarction) ([place])          Dorie Rank, MD 09/07/17 2050

## 2017-09-07 NOTE — ED Notes (Signed)
MD notified of Troponin 

## 2017-09-07 NOTE — ED Notes (Signed)
Nitro drip stopped after patient's blood pressure dropped. EDP notified

## 2017-09-07 NOTE — ED Triage Notes (Signed)
Patient c/o L sided chest pain that started last night. Patient reports SOB yesterday with activity.

## 2017-09-07 NOTE — Progress Notes (Signed)
ANTICOAGULATION CONSULT NOTE - Initial Consult  Pharmacy Consult for Heparin Indication: chest pain/ACS  No Known Allergies  Patient Measurements: Height: 5\' 7"  (170.2 cm) Weight: 125 lb (56.7 kg) IBW/kg (Calculated) : 61.6 HEPARIN DW (KG): 56.7   Vital Signs: Temp: 98.2 F (36.8 C) (06/12 1925) Temp Source: Oral (06/12 1925) BP: 188/109 (06/12 1925) Pulse Rate: 75 (06/12 1925)  Labs: Recent Labs    09/07/17 1948  HGB 14.8  HCT 44.9  PLT 235    CrCl cannot be calculated (Patient's most recent lab result is older than the maximum 21 days allowed.).   Medical History: Past Medical History:  Diagnosis Date  . Adnexal mass 2016   resolved by ultrasound 07/29/16.  Marland Kitchen CHF (congestive heart failure) ([place])   . Coronary artery disease   . Herpes simplex type 1 infection   . Myocardial infarction [facility])    Medications:   (Not in a hospital admission) Reviewed, current med list pending.  Assessment: Okay for Protocol, baseline anticoag labs pending. Elevated Troponin.   Goal of Therapy:  Heparin level 0.3-0.7 units/ml Monitor platelets by anticoagulation protocol: Yes   Plan:  Give 3000 units bolus x 1 Start heparin infusion at 750 units/hr Check anti-Xa level in 6-8 hours and daily while on heparin Continue to monitor H&H and platelets  Pricilla Larsson 09/07/2017,8:08 PM

## 2017-09-07 NOTE — ED Notes (Signed)
CRITICAL VALUE ALERT  Critical Value:  Troponin 4.91  Date & Time Notied:  09/07/17 2046  Provider Notified: Tomi Bamberger, EDP  Orders Received/Actions taken: No orders at this time

## 2017-09-08 ENCOUNTER — Encounter (HOSPITAL_COMMUNITY): Payer: Self-pay | Admitting: General Practice

## 2017-09-08 ENCOUNTER — Encounter (HOSPITAL_COMMUNITY)
Admission: EM | Disposition: A | Discharge status: Home / Self Care | Payer: Self-pay | Source: Home / Self Care | Attending: Cardiovascular Disease

## 2017-09-08 DIAGNOSIS — I214 Non-ST elevation (NSTEMI) myocardial infarction: Principal | ICD-10-CM

## 2017-09-08 DIAGNOSIS — I249 Acute ischemic heart disease, unspecified: Secondary | ICD-10-CM | POA: Diagnosis present

## 2017-09-08 HISTORY — PX: LEFT HEART CATH AND CORONARY ANGIOGRAPHY: CATH118249

## 2017-09-08 LAB — HIV ANTIBODY (ROUTINE TESTING W REFLEX): HIV SCREEN 4TH GENERATION: NONREACTIVE

## 2017-09-08 LAB — CBC
HCT: 39.3 % (ref 36.0–46.0)
HEMATOCRIT: 42.1 % (ref 36.0–46.0)
Hemoglobin: 13.1 g/dL (ref 12.0–15.0)
Hemoglobin: 12.6 g/dL (ref 12.0–15.0)
MCH: 24 pg — ABNORMAL LOW (ref 26.0–34.0)
MCH: 24.7 pg — AB (ref 26.0–34.0)
MCHC: 31.1 g/dL (ref 30.0–36.0)
MCHC: 32.1 g/dL (ref 30.0–36.0)
MCV: 77.2 fL — AB (ref 78.0–100.0)
MCV: 76.9 fL — ABNORMAL LOW (ref 78.0–100.0)
Platelets: 220 10*3/uL (ref 150–400)
Platelets: 204 10*3/uL (ref 150–400)
RBC: 5.45 MIL/uL — AB (ref 3.87–5.11)
RBC: 5.11 MIL/uL (ref 3.87–5.11)
RDW: 19.9 % — ABNORMAL HIGH (ref 11.5–15.5)
RDW: 19.6 % — AB (ref 11.5–15.5)
WBC: 10.5 10*3/uL (ref 4.0–10.5)
WBC: 9 10*3/uL (ref 4.0–10.5)

## 2017-09-08 LAB — TROPONIN I
TROPONIN I: 3.61 ng/mL — AB (ref <0.03)
Troponin I: 5.16 ng/mL (ref <0.03)
Troponin I: 3.65 ng/mL (ref <0.03)

## 2017-09-08 LAB — PROTIME-INR
INR: 1.12
PROTHROMBIN TIME: 14.3 s (ref 11.4–15.2)

## 2017-09-08 LAB — BASIC METABOLIC PANEL
Anion gap: 10 (ref 5–15)
BUN: 5 mg/dL — AB (ref 6–20)
CALCIUM: 9.1 mg/dL (ref 8.9–10.3)
CO2: 24 mmol/L (ref 22–32)
Chloride: 101 mmol/L (ref 101–111)
Creatinine, Ser: 0.81 mg/dL (ref 0.44–1.00)
GFR calc Af Amer: >60 mL/min (ref >60)
GFR calc non Af Amer: >60 mL/min (ref >60)
Glucose, Bld: 126 mg/dL — ABNORMAL HIGH (ref 65–99)
Potassium: 3.9 mmol/L (ref 3.5–5.1)
Sodium: 135 mmol/L (ref 135–145)

## 2017-09-08 LAB — MRSA PCR SCREENING: MRSA BY PCR: NEGATIVE

## 2017-09-08 LAB — POCT ACTIVATED CLOTTING TIME: ACTIVATED CLOTTING TIME: 340 s

## 2017-09-08 LAB — HEPARIN LEVEL (UNFRACTIONATED): Heparin Unfractionated: 0.49 IU/mL (ref 0.30–0.70)

## 2017-09-08 SURGERY — LEFT HEART CATH AND CORONARY ANGIOGRAPHY
Anesthesia: LOCAL

## 2017-09-08 MED ORDER — SODIUM CHLORIDE 0.9% FLUSH
3.0000 mL | Freq: Two times a day (BID) | INTRAVENOUS | Status: DC
  Administered 2017-09-08 – 2017-09-11 (×6): 3 mL via INTRAVENOUS

## 2017-09-08 MED ORDER — ACETAMINOPHEN 325 MG PO TABS: 650.0000 mg | ORAL_TABLET | ORAL | Status: DC | PRN

## 2017-09-08 MED ORDER — BIVALIRUDIN BOLUS VIA INFUSION - CUPID
INTRAVENOUS | Status: DC | PRN
  Administered 2017-09-08: 42.075 mg via INTRAVENOUS

## 2017-09-08 MED ORDER — ONDANSETRON HCL 4 MG/2ML IJ SOLN
4.0000 mg | Freq: Four times a day (QID) | INTRAMUSCULAR | Status: DC | PRN
  Administered 2017-09-08 (×2): 4 mg via INTRAVENOUS
  Filled 2017-09-08 (×2): qty 2

## 2017-09-08 MED ORDER — ASPIRIN EC 81 MG PO TBEC: 81.0000 mg | DELAYED_RELEASE_TABLET | Freq: Every day | ORAL | Status: DC

## 2017-09-08 MED ORDER — LABETALOL HCL 5 MG/ML IV SOLN: 10.0000 mg | INTRAVENOUS | Status: AC | PRN

## 2017-09-08 MED ORDER — ONDANSETRON HCL 4 MG/2ML IJ SOLN
INTRAMUSCULAR | Status: AC
  Filled 2017-09-08: qty 2

## 2017-09-08 MED ORDER — ASPIRIN EC 81 MG PO TBEC
81.0000 mg | DELAYED_RELEASE_TABLET | Freq: Every day | ORAL | Status: DC
  Administered 2017-09-09 – 2017-09-11 (×3): 81 mg via ORAL
  Filled 2017-09-08 (×3): qty 1

## 2017-09-08 MED ORDER — HYDRALAZINE HCL 20 MG/ML IJ SOLN
INTRAMUSCULAR | Status: AC
  Filled 2017-09-08: qty 1

## 2017-09-08 MED ORDER — ONDANSETRON HCL 4 MG/2ML IJ SOLN
INTRAMUSCULAR | Status: DC | PRN
  Administered 2017-09-08: 4 mg via INTRAVENOUS

## 2017-09-08 MED ORDER — MIDAZOLAM HCL 2 MG/2ML IJ SOLN
INTRAMUSCULAR | Status: DC | PRN
  Administered 2017-09-08: 2 mg via INTRAVENOUS
  Administered 2017-09-08: 1 mg via INTRAVENOUS

## 2017-09-08 MED ORDER — MORPHINE SULFATE (PF) 2 MG/ML IV SOLN
2.0000 mg | Freq: Once | INTRAVENOUS | Status: AC
  Administered 2017-09-08: 2 mg via INTRAVENOUS
  Filled 2017-09-08: qty 1

## 2017-09-08 MED ORDER — SODIUM CHLORIDE 0.9 % IV SOLN: 250.0000 mL | INTRAVENOUS | Status: DC | PRN

## 2017-09-08 MED ORDER — ATORVASTATIN CALCIUM 80 MG PO TABS
80.0000 mg | ORAL_TABLET | Freq: Every day | ORAL | Status: DC
  Administered 2017-09-08 – 2017-09-10 (×3): 80 mg via ORAL
  Filled 2017-09-08 (×3): qty 1

## 2017-09-08 MED ORDER — FENTANYL CITRATE (PF) 100 MCG/2ML IJ SOLN
INTRAMUSCULAR | Status: AC
  Filled 2017-09-08: qty 2

## 2017-09-08 MED ORDER — HEPARIN (PORCINE) IN NACL 2-0.9 UNITS/ML
INTRAMUSCULAR | Status: AC | PRN
  Administered 2017-09-08 (×2): 500 mL

## 2017-09-08 MED ORDER — SODIUM CHLORIDE 0.9 % WEIGHT BASED INFUSION: 3.0000 mL/kg/h | INTRAVENOUS | Status: DC

## 2017-09-08 MED ORDER — SODIUM CHLORIDE 0.9% FLUSH: 3.0000 mL | INTRAVENOUS | Status: DC | PRN

## 2017-09-08 MED ORDER — ASPIRIN 81 MG PO CHEW
81.0000 mg | CHEWABLE_TABLET | ORAL | Status: AC
  Administered 2017-09-08: 81 mg via ORAL

## 2017-09-08 MED ORDER — NITROGLYCERIN 1 MG/10 ML FOR IR/CATH LAB
INTRA_ARTERIAL | Status: AC
  Filled 2017-09-08: qty 10

## 2017-09-08 MED ORDER — SODIUM CHLORIDE 0.9% FLUSH
3.0000 mL | Freq: Two times a day (BID) | INTRAVENOUS | Status: DC
  Administered 2017-09-08 – 2017-09-11 (×5): 3 mL via INTRAVENOUS

## 2017-09-08 MED ORDER — SODIUM CHLORIDE 0.9 % IV SOLN
INTRAVENOUS | Status: DC | PRN
  Administered 2017-09-08: 1.75 mg/kg/h via INTRAVENOUS

## 2017-09-08 MED ORDER — MIDAZOLAM HCL 2 MG/2ML IJ SOLN
INTRAMUSCULAR | Status: AC
  Filled 2017-09-08: qty 2

## 2017-09-08 MED ORDER — ATORVASTATIN CALCIUM 40 MG PO TABS: 40.0000 mg | ORAL_TABLET | Freq: Every day | ORAL | Status: DC

## 2017-09-08 MED ORDER — SODIUM CHLORIDE 0.9% FLUSH
3.0000 mL | Freq: Two times a day (BID) | INTRAVENOUS | Status: DC
  Administered 2017-09-08: 09:00:00 3 mL via INTRAVENOUS

## 2017-09-08 MED ORDER — FENTANYL CITRATE (PF) 100 MCG/2ML IJ SOLN
INTRAMUSCULAR | Status: DC | PRN
  Administered 2017-09-08 (×2): 25 ug via INTRAVENOUS

## 2017-09-08 MED ORDER — SODIUM CHLORIDE 0.9 % IV SOLN
INTRAVENOUS | Status: AC
  Administered 2017-09-08: 12:00:00 via INTRAVENOUS

## 2017-09-08 MED ORDER — SODIUM CHLORIDE 0.9 % WEIGHT BASED INFUSION: 1.0000 mL/kg/h | INTRAVENOUS | Status: DC

## 2017-09-08 MED ORDER — NITROGLYCERIN 0.4 MG SL SUBL: 0.4000 mg | SUBLINGUAL_TABLET | SUBLINGUAL | Status: DC | PRN

## 2017-09-08 MED ORDER — LIDOCAINE HCL (PF) 1 % IJ SOLN
INTRAMUSCULAR | Status: DC | PRN
  Administered 2017-09-08: 15 mL

## 2017-09-08 MED ORDER — HEPARIN (PORCINE) IN NACL 1000-0.9 UT/500ML-% IV SOLN
INTRAVENOUS | Status: AC
  Filled 2017-09-08: qty 1000

## 2017-09-08 MED ORDER — ASPIRIN 81 MG PO CHEW: 81.0000 mg | CHEWABLE_TABLET | Freq: Every day | ORAL | Status: DC

## 2017-09-08 MED ORDER — HYDRALAZINE HCL 20 MG/ML IJ SOLN: 5.0000 mg | INTRAMUSCULAR | Status: AC | PRN

## 2017-09-08 MED ORDER — BIVALIRUDIN TRIFLUOROACETATE 250 MG IV SOLR
INTRAVENOUS | Status: AC
  Filled 2017-09-08: qty 250

## 2017-09-08 MED ORDER — HYDRALAZINE HCL 20 MG/ML IJ SOLN
INTRAMUSCULAR | Status: DC | PRN
  Administered 2017-09-08: 10 mg via INTRAVENOUS

## 2017-09-08 MED ORDER — METOPROLOL TARTRATE 12.5 MG HALF TABLET
12.5000 mg | ORAL_TABLET | Freq: Two times a day (BID) | ORAL | Status: DC
  Administered 2017-09-08 (×2): 12.5 mg via ORAL
  Filled 2017-09-08 (×2): qty 1

## 2017-09-08 MED ORDER — IOHEXOL 350 MG/ML SOLN
INTRAVENOUS | Status: DC | PRN
  Administered 2017-09-08: 70 mL via INTRAVENOUS

## 2017-09-08 MED ORDER — LIDOCAINE HCL (PF) 1 % IJ SOLN
INTRAMUSCULAR | Status: AC
  Filled 2017-09-08: qty 30

## 2017-09-08 MED ORDER — ONDANSETRON HCL 4 MG/2ML IJ SOLN
4.0000 mg | Freq: Four times a day (QID) | INTRAMUSCULAR | Status: DC | PRN
  Administered 2017-09-08: 4 mg via INTRAVENOUS

## 2017-09-08 SURGICAL SUPPLY — 17 items
CATH INFINITI 5FR MULTPACK ANG (CATHETERS) ×1 IMPLANT
CATH LAUNCHER 6FR JR4 (CATHETERS) ×1 IMPLANT
COVER PRB 48X5XTLSCP FOLD TPE (BAG) IMPLANT
COVER PROBE 5X48 (BAG) ×2
KIT ENCORE 26 ADVANTAGE (KITS) ×1 IMPLANT
KIT HEART LEFT (KITS) ×2 IMPLANT
KIT HEMO VALVE WATCHDOG (MISCELLANEOUS) ×1 IMPLANT
NDL PERC 21GX4CM (NEEDLE) IMPLANT
NEEDLE PERC 21GX4CM (NEEDLE) ×2 IMPLANT
PACK CARDIAC CATHETERIZATION (CUSTOM PROCEDURE TRAY) ×2 IMPLANT
SHEATH PINNACLE 5F 10CM (SHEATH) ×1 IMPLANT
SHEATH PINNACLE 6F 10CM (SHEATH) ×1 IMPLANT
SHEATH RAIN RADIAL 21G 6FR (SHEATH) ×1 IMPLANT
TRANSDUCER W/STOPCOCK (MISCELLANEOUS) ×2 IMPLANT
TUBING CIL FLEX 10 FLL-RA (TUBING) ×2 IMPLANT
WIRE ASAHI PROWATER 180CM (WIRE) ×1 IMPLANT
WIRE EMERALD 3MM-J .035X150CM (WIRE) ×1 IMPLANT

## 2017-09-08 NOTE — Progress Notes (Signed)
[place] for Heparin Indication: chest pain/ACS  No Known Allergies  Patient Measurements: Height: 5\' 7"  (170.2 cm) Weight: 123 lb 10.9 oz (56.1 kg) IBW/kg (Calculated) : 61.6 HEPARIN DW (KG): 56.1   Vital Signs: Temp: 98.3 F (36.8 C) (06/12 2338) Temp Source: Oral (06/12 2338) BP: 154/99 (06/13 0230) Pulse Rate: 87 (06/13 0230)  Labs: Recent Labs    09/07/17 1948 09/07/17 1949 09/08/17 0254  HGB 14.8  --  13.1  HCT 44.9  --  42.1  PLT 235  --  220  APTT 33  --   --   LABPROT 13.8  --   --   INR 1.07  --   --   HEPARINUNFRC  --   --  0.49  CREATININE 0.89  --   --   TROPONINI  --  4.91*  --     Estimated Creatinine Clearance: 64 mL/min (by C-G formula based on SCr of 0.89 mg/dL).   Medical History: Past Medical History:  Diagnosis Date  . Adnexal mass 2016   resolved by ultrasound 07/29/16.  Marland Kitchen CHF (congestive heart failure) ([place])   . Coronary artery disease   . Herpes simplex type 1 infection   . Myocardial infarction [facility])    Medications:  Medications Prior to Admission  Medication Sig Dispense Refill Last Dose  . aspirin EC 81 MG EC tablet Take 1 tablet (81 mg total) by mouth daily.   09/07/2017 at Unknown time  . Cholecalciferol LIQD Take 4,000 Units by mouth daily. Uses one drop on the tongue each morning   09/07/2017 at Unknown time  . Milk Thistle 250 MG CAPS Take 250 mg by mouth daily.   Past Month at Unknown time  . Multiple Vitamins-Minerals (EMERGEN-C VITAMIN C) PACK Take 1 Package by mouth daily.   09/06/2017 at Unknown time   Reviewed, current med list pending.  Assessment: 52 yo lady transferred from [facility] on heparin drip at 750 units/hr.  Initial heparin level is therapeutic.    Goal of Therapy:  Heparin level 0.3-0.7 units/ml Monitor platelets by anticoagulation protocol: Yes   Plan:  Continue heparin drip at 750 units/hr Check heparin level in 6 hours to confirm  Mekiyah Gladwell [place] 09/08/2017,3:57  AM

## 2017-09-08 NOTE — H&P (Signed)
Cardiology Admission History and Physical:   Patient ID: Karina Richards; MRN: [id number]; DOB: April 24, 1965   Admission date: 09/07/2017  Primary Care Provider: Manon Hilding, MD Primary Cardiologist: Sinclair Grooms, MD  Admitting Cardiologist: Bronson Ing, MD  Chief Complaint:  Chest pain (unstable angina)   History of Present Illness:   Karina Richards is a 52 year old female with hx of CAD (stent to LAD in 2014), hypertension and hyperlipidemia presents to the hospital with complaints of left sided chest pain. The patient reports that she has had chest pain for 2 days. It is associated with some shortness of breath. It is felt as a soreness on the left side. No radiation of the pain. She denies any nausea or vomiting or diaphoresis.  Back in 2014 she had similar chest pain but of greater intensity. She had a crushing sensation over her chest. A cardiac cath resulted in a stent placement to the LAD. She has done well since. Till her new episode of chest pain she was exercising regularly.  She has been compliant with the ASA. She however stopped her BP medications on the advice of her physician. She is not on a statin.  There is a strong family history of CAD. Her mother had a CABG in her 43s.  [name] reports significant stress recently. Her co-worker and good friend died unexpectedly a few days ago. Also her son (who is 56 years old) is starting chemotherapy for some malignancy.   Past Medical History:  Diagnosis Date  . Adnexal mass 2016   resolved by ultrasound 07/29/16.  Marland Kitchen CHF (congestive heart failure) ([place])   . Coronary artery disease   . Herpes simplex type 1 infection   . Myocardial infarction [facility])     Past Surgical History:  Procedure Laterality Date  . INGUINAL HERNIA REPAIR Right 2008  . LEFT HEART CATHETERIZATION WITH CORONARY ANGIOGRAM N/A 12/31/2012   LAD DES     Medications Prior to Admission: Prior to Admission medications   Medication Sig Start Date End Date  Taking? Authorizing Provider  aspirin EC 81 MG EC tablet Take 1 tablet (81 mg total) by mouth daily. 01/04/13  Yes Belva Crome, MD  Cholecalciferol LIQD Take 4,000 Units by mouth daily. Uses one drop on the tongue each morning   Yes [provider]  Milk Thistle 250 MG CAPS Take 250 mg by mouth daily.   Yes [provider]  Multiple Vitamins-Minerals (EMERGEN-C VITAMIN C) PACK Take 1 Package by mouth daily.   Yes [provider]     Allergies:   No Known Allergies  Social History:  She is a non-smoker. She is married. She has 2 children (a 62 year old and a 42 year old). She works for [employer].  Social History   Socioeconomic History  . Marital status: Married    Spouse name: Not on file  . Number of children: Not on file  . Years of education: Not on file  . Highest education level: Not on file  Occupational History  . Not on file  Social Needs  . Financial resource strain: Not on file  . Food insecurity:    Worry: Not on file    Inability: Not on file  . Transportation needs:    Medical: Not on file    Non-medical: Not on file  Tobacco Use  . Smoking status: Never Smoker  . Smokeless tobacco: Never Used  Substance and Sexual Activity  . Alcohol use: Yes  Alcohol/week: 2.4 oz    Types: 4 Standard drinks or equivalent per week  . Drug use: No  . Sexual activity: Yes    Partners: Male    Birth control/protection: Post-menopausal  Lifestyle  . Physical activity:    Days per week: Not on file    Minutes per session: Not on file  . Stress: Not on file  Relationships  . Social connections:    Talks on phone: Not on file    Gets together: Not on file    Attends religious service: Not on file    Active member of club or organization: Not on file    Attends meetings of clubs or organizations: Not on file    Relationship status: Not on file  . Intimate partner violence:    Fear of current or ex partner: Not on file    Emotionally abused: Not on  file    Physically abused: Not on file    Forced sexual activity: Not on file  Other Topics Concern  . Not on file  Social History Narrative  . Not on file    Family History:   The patient's family history includes COPD in her mother; Heart attack in her maternal grandmother; Heart disease in her father and mother; Osteoporosis in her mother; Testicular cancer in her son.  Mother had a CABG in her 26s  Review of Systems: [y] = yes, [ ]  = no   . General: Weight gain [ ] ; Weight loss [ ] ; Anorexia [ ] ; Fatigue [ ] ; Fever [ ] ; Chills [ ] ; Weakness [ ]   . Cardiac: Chest pain/pressure [Y ]; Resting SOB [Y ]; Exertional SOB [ ] ; Orthopnea [ ] ; Pedal Edema [ ] ; Palpitations [ ] ; Syncope [ ] ; Presyncope [ ] ; Paroxysmal nocturnal dyspnea[ ]   . Pulmonary: Cough [ ] ; Wheezing[ ] ; Hemoptysis[ ] ; Sputum [ ] ; Snoring [ ]   . GI: Vomiting[ ] ; Dysphagia[ ] ; Melena[ ] ; Hematochezia [ ] ; Heartburn[ ] ; Abdominal pain [ ] ; Constipation [ ] ; Diarrhea [ ] ; BRBPR [ ]   . GU: Hematuria[ ] ; Dysuria [ ] ; Nocturia[ ]   . Vascular: Pain in legs with walking [ ] ; Pain in feet with lying flat [ ] ; Non-healing sores [ ] ; Stroke [ ] ; TIA [ ] ; Slurred speech [ ] ;  . Neuro: Headaches[ Y]; Vertigo[ ] ; Seizures[ ] ; Paresthesias[ ] ;Blurred vision [ ] ; Diplopia [ ] ; Vision changes [ ]   . Ortho/Skin: Arthritis [ ] ; Joint pain [ ] ; Muscle pain [ ] ; Joint swelling [ ] ; Back Pain [ ] ; Rash [ ]   . Psych: Depression[ ] ; Anxiety[ ]   . Heme: Bleeding problems [ ] ; Clotting disorders [ ] ; Anemia [ ]   . Endocrine: Diabetes [ ] ; Thyroid dysfunction[ ]     Physical Exam/Data:   Vitals:   09/08/17 0100 09/08/17 0130 09/08/17 0200 09/08/17 0230  BP: 136/89 (!) 154/96 (!) 142/94 (!) 154/99  Pulse: 73 78 79 87  Resp: 12 11 12 12   Temp:      TempSrc:      SpO2: 98% 99% 97% 99%  Weight:      Height:        Intake/Output Summary (Last 24 hours) at 09/08/2017 0257 Last data filed at 09/08/2017 0100 Gross per 24 hour  Intake 1343.53 ml    Output 700 ml  Net 643.53 ml   Filed Weights   09/07/17 1927 09/07/17 2338  Weight: 56.7 kg (125 lb) 56.1 kg (123 lb 10.9 oz)   Body mass  index is 19.37 kg/m.  General:  Well nourished, well developed, in no acute distress HEENT: normal Lymph: no adenopathy Neck: no JVD Endocrine:  No thryomegaly Vascular: No carotid bruits; FA pulses 2+ bilaterally without bruits  Cardiac:  normal S1, S2; RRR; no murmur  Lungs:  clear to auscultation bilaterally, no wheezing, rhonchi or rales  Abd: soft, nontender, no hepatomegaly  Ext: no edema Musculoskeletal:  No deformities, BUE and BLE strength normal and equal Skin: warm and dry  Neuro:  CNs 2-12 intact, no focal abnormalities noted Psych:  Normal affect    EKG:  The ECG that was done and was personally reviewed and demonstrates poor R progression in the anterior leads. T wave inversions in the inferior leads   Laboratory Data:  Chemistry Recent Labs  Lab 09/07/17 1948  NA 134*  K 4.1  CL 97*  CO2 26  GLUCOSE 103*  BUN 13  CREATININE 0.89  CALCIUM 10.1  GFRNONAA >60  GFRAA >60  ANIONGAP 11    No results for input(s): PROT, ALBUMIN, AST, ALT, ALKPHOS, BILITOT in the last 168 hours. Hematology Recent Labs  Lab 09/07/17 1948  WBC 11.1*  RBC 5.87*  HGB 14.8  HCT 44.9  MCV 76.5*  MCH 25.2*  MCHC 33.0  RDW 18.9*  PLT 235   Cardiac Enzymes Recent Labs  Lab 09/07/17 1949  TROPONINI 4.91*    Recent Labs  Lab 09/07/17 1950  TROPIPOC 2.87*    BNPNo results for input(s): BNP, PROBNP in the last 168 hours.  DDimer No results for input(s): DDIMER in the last 168 hours.  Radiology/Studies:  Dg Chest Portable 1 View  Result Date: 09/07/2017 CLINICAL DATA:  Left-sided chest pain.  Shortness of breath. EXAM: PORTABLE CHEST 1 VIEW COMPARISON:  January 02, 2013 FINDINGS: Nipple shadows project over the lung bases. No pneumothorax. No suspicious nodules or masses. No focal infiltrates. The heart, hila, and mediastinum  are normal. IMPRESSION: No active disease. Electronically Signed   By: Dorise Bullion III M.D   On: 09/07/2017 20:06    Assessment and Plan:   1. NSTEMI  The patient has a history of CAD with stent to the LAD in 2014. She presents with typical symptoms and her cardiac enzymes are elevated.   The ECG reveals inverted T waves in the inferior leads suggesting a recent ischemic event. There are mild ST elevations that do not meet diagnostic criteria.  She is chest pain free on IV NTG and heparin  Will plan on cardiac catheterization in the morning. She will stay NPO. Continue ASA  2. Hyperlipidemia  Restart high-dose statins.       For questions or updates, please contact [place] Please consult [email or web address] for contact info under Cardiology/STEMI.    Signed, Meade Maw, MD  09/08/2017 2:57 AM

## 2017-09-08 NOTE — Interval H&P Note (Signed)
Cath Lab Visit (complete for each Cath Lab visit)  Clinical Evaluation Leading to the Procedure:   ACS: Yes.    Non-ACS:    Anginal Classification: CCS IV  Anti-ischemic medical therapy: Minimal Therapy (1 class of medications)  Non-Invasive Test Results: No non-invasive testing performed  Prior CABG: No previous CABG      History and Physical Interval Note:  09/08/2017 9:26 AM  Karina Richards  has presented today for surgery, with the diagnosis of cp  The various methods of treatment have been discussed with the patient and family. After consideration of risks, benefits and other options for treatment, the patient has consented to  Procedure(s): LEFT HEART CATH AND CORONARY ANGIOGRAPHY (N/A) as a surgical intervention .  The patient's history has been reviewed, patient examined, no change in status, stable for surgery.  I have reviewed the patient's chart and labs.  Questions were answered to the patient's satisfaction.     Larae Grooms

## 2017-09-08 NOTE — Progress Notes (Addendum)
The patient has been seen in conjunction with Fabian Sharp, PA-C. All aspects of care have been considered and discussed. The patient has been personally interviewed, examined, and all clinical data has been reviewed.   NSTE-acute coronary syndrome.  Suspect LAD as culprit vs Stress CM.  Ongoing pain --> needs cath ASAP. Discussed with the cath lab.  Cardiac catheterization with probable repeat PCI today.  May need to move up on schedule if any recurrent chest discomfort.  He with high intensity statin therapy.  Progress Note  Patient Name: Karina Richards Date of Encounter: 09/08/2017  Primary Cardiologist: Sinclair Grooms, MD   Subjective   Pt states her chest pain is down to a 1-2/10 and does not want the nitro drip titrated because it makes her sick. Heparin drip running. No breathing problems.  Inpatient Medications    Scheduled Meds: . [START ON 09/09/2017] aspirin EC  81 mg Oral Daily  . atorvastatin  40 mg Oral q1800  . sodium chloride flush  3 mL Intravenous Q12H   Continuous Infusions: . sodium chloride    . heparin 750 Units/hr (09/08/17 0700)  . nitroGLYCERIN 5 mcg/min (09/08/17 0700)   PRN Meds: sodium chloride, acetaminophen, nitroGLYCERIN, ondansetron (ZOFRAN) IV, sodium chloride flush   Vital Signs    Vitals:   09/08/17 0200 09/08/17 0230 09/08/17 0604 09/08/17 0700  BP: (!) 142/94 (!) 154/99 (!) 149/88 133/63  Pulse: 79 87    Resp: 12 12 15 13   Temp:   98.6 F (37 C)   TempSrc:   Oral   SpO2: 97% 99% 99% 99%  Weight:  123 lb 10.9 oz (56.1 kg)    Height:        Intake/Output Summary (Last 24 hours) at 09/08/2017 0716 Last data filed at 09/08/2017 0700 Gross per 24 hour  Intake 1524.03 ml  Output 1300 ml  Net 224.03 ml   Filed Weights   09/07/17 1927 09/07/17 2338 09/08/17 0230  Weight: 125 lb (56.7 kg) 123 lb 10.9 oz (56.1 kg) 123 lb 10.9 oz (56.1 kg)    Telemetry    Sinus rhythm - Personally Reviewed  ECG    No new tracings -  Personally Reviewed  Physical Exam   GEN: No acute distress.   Neck: No JVD Cardiac: RRR, no murmurs, rubs, or gallops.  Respiratory: Clear to auscultation bilaterally. GI: Soft, nontender, non-distended  MS: No edema; No deformity. Neuro:  Nonfocal  Psych: Normal affect   Labs    Chemistry Recent Labs  Lab 09/07/17 1948  NA 134*  K 4.1  CL 97*  CO2 26  GLUCOSE 103*  BUN 13  CREATININE 0.89  CALCIUM 10.1  GFRNONAA >60  GFRAA >60  ANIONGAP 11     Hematology Recent Labs  Lab 09/07/17 1948 09/08/17 0254  WBC 11.1* 10.5  RBC 5.87* 5.45*  HGB 14.8 13.1  HCT 44.9 42.1  MCV 76.5* 77.2*  MCH 25.2* 24.0*  MCHC 33.0 31.1  RDW 18.9* 19.9*  PLT 235 220    Cardiac Enzymes Recent Labs  Lab 09/07/17 1949 09/08/17 0254  TROPONINI 4.91* 5.16*    Recent Labs  Lab 09/07/17 1950  TROPIPOC 2.87*     BNPNo results for input(s): BNP, PROBNP in the last 168 hours.   DDimer No results for input(s): DDIMER in the last 168 hours.   Radiology    Dg Chest Portable 1 View  Result Date: 09/07/2017 CLINICAL DATA:  Left-sided chest pain.  Shortness of  breath. EXAM: PORTABLE CHEST 1 VIEW COMPARISON:  January 02, 2013 FINDINGS: Nipple shadows project over the lung bases. No pneumothorax. No suspicious nodules or masses. No focal infiltrates. The heart, hila, and mediastinum are normal. IMPRESSION: No active disease. Electronically Signed   By: Dorise Bullion III M.D   On: 09/07/2017 20:06    Cardiac Studies   Left heart cath pending  Echo 2014 Study Conclusions  - Left ventricle: The cavity size was normal. Systolic function was mildly reduced. The estimated ejection fraction was in the range of 45% to 50%. There is akinesis of the apical myocardium. Doppler parameters are consistent with abnormal left ventricular relaxation (grade 1 diastolic dysfunction). Acoustic contrast opacification revealed no evidence ofthrombus. - Aortic valve: Trivial  regurgitation.  Patient Profile     52 y.o. 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 She presented with chest pain and found to have an elevated troponin.   Assessment & Plan    1. NSTEMI - troponin peaked at 2.87 --> 4.91 --> 5.16 - EKG with TWI in inferior leads and mild ST elevations noted to not meet diagnostic criteria for STEMI - pt continues to have 2/10 pain on nitro drip, but does not want nitro titrated - will consider echocardiogram given her previously mild decrease in LVEF following her MI in 2014 - plan for left heart cath today   2. HTN - had recently been taken off of BP meds at the advice of her PCP - pressures here have been uncontrolled - will likely need to restart beta blocker and possibly ACEI/ARB   3. HLD - 06/15/2017: Cholesterol, Total 275; HDL 74; LDL Calculated 191; Triglycerides 48 - LDL goal is less than 70 - will increase lipitor that was started last night to 80 mg    For questions or updates, please contact [place] Please consult [email or web address] for contact info under Cardiology/STEMI.      Signed, Tami Lin Duke, PA  09/08/2017, 7:16 AM

## 2017-09-08 NOTE — Research (Signed)
[employer] research study reviewed with patient and patients daughter. Patient was extremely drowsy. Daughter and patient had great questions. ICF left for review. Research will follow up tomorrow prior to discharge for decision to participate or not.

## 2017-09-08 NOTE — Progress Notes (Signed)
Site area: right groin  Site Prior to Removal:  Level 0  Pressure Applied For 20 MINUTES    Minutes Beginning at 1310  Manual:   Yes.    Patient Status During Pull:  stable  Post Pull Groin Site:  Level 0  Post Pull Instructions Given:  Yes.    Post Pull Pulses Present:  Yes.    Dressing Applied:  Yes.    Comments:   

## 2017-09-08 NOTE — H&P (View-Only) (Signed)
The patient has been seen in conjunction with Fabian Sharp, PA-C. All aspects of care have been considered and discussed. The patient has been personally interviewed, examined, and all clinical data has been reviewed.   NSTE-acute coronary syndrome.  Suspect LAD as culprit vs Stress CM.  Ongoing pain --> needs cath ASAP. Discussed with the cath lab.  Cardiac catheterization with probable repeat PCI today.  May need to move up on schedule if any recurrent chest discomfort.  He with high intensity statin therapy.  Progress Note  Patient Name: Karina Richards Date of Encounter: 09/08/2017  Primary Cardiologist: Sinclair Grooms, MD   Subjective   Pt states her chest pain is down to a 1-2/10 and does not want the nitro drip titrated because it makes her sick. Heparin drip running. No breathing problems.  Inpatient Medications    Scheduled Meds: . [START ON 09/09/2017] aspirin EC  81 mg Oral Daily  . atorvastatin  40 mg Oral q1800  . sodium chloride flush  3 mL Intravenous Q12H   Continuous Infusions: . sodium chloride    . heparin 750 Units/hr (09/08/17 0700)  . nitroGLYCERIN 5 mcg/min (09/08/17 0700)   PRN Meds: sodium chloride, acetaminophen, nitroGLYCERIN, ondansetron (ZOFRAN) IV, sodium chloride flush   Vital Signs    Vitals:   09/08/17 0200 09/08/17 0230 09/08/17 0604 09/08/17 0700  BP: (!) 142/94 (!) 154/99 (!) 149/88 133/63  Pulse: 79 87    Resp: 12 12 15 13   Temp:   98.6 F (37 C)   TempSrc:   Oral   SpO2: 97% 99% 99% 99%  Weight:  123 lb 10.9 oz (56.1 kg)    Height:        Intake/Output Summary (Last 24 hours) at 09/08/2017 0716 Last data filed at 09/08/2017 0700 Gross per 24 hour  Intake 1524.03 ml  Output 1300 ml  Net 224.03 ml   Filed Weights   09/07/17 1927 09/07/17 2338 09/08/17 0230  Weight: 125 lb (56.7 kg) 123 lb 10.9 oz (56.1 kg) 123 lb 10.9 oz (56.1 kg)    Telemetry    Sinus rhythm - Personally Reviewed  ECG    No new tracings -  Personally Reviewed  Physical Exam   GEN: No acute distress.   Neck: No JVD Cardiac: RRR, no murmurs, rubs, or gallops.  Respiratory: Clear to auscultation bilaterally. GI: Soft, nontender, non-distended  MS: No edema; No deformity. Neuro:  Nonfocal  Psych: Normal affect   Labs    Chemistry Recent Labs  Lab 09/07/17 1948  NA 134*  K 4.1  CL 97*  CO2 26  GLUCOSE 103*  BUN 13  CREATININE 0.89  CALCIUM 10.1  GFRNONAA >60  GFRAA >60  ANIONGAP 11     Hematology Recent Labs  Lab 09/07/17 1948 09/08/17 0254  WBC 11.1* 10.5  RBC 5.87* 5.45*  HGB 14.8 13.1  HCT 44.9 42.1  MCV 76.5* 77.2*  MCH 25.2* 24.0*  MCHC 33.0 31.1  RDW 18.9* 19.9*  PLT 235 220    Cardiac Enzymes Recent Labs  Lab 09/07/17 1949 09/08/17 0254  TROPONINI 4.91* 5.16*    Recent Labs  Lab 09/07/17 1950  TROPIPOC 2.87*     BNPNo results for input(s): BNP, PROBNP in the last 168 hours.   DDimer No results for input(s): DDIMER in the last 168 hours.   Radiology    Dg Chest Portable 1 View  Result Date: 09/07/2017 CLINICAL DATA:  Left-sided chest pain.  Shortness of  breath. EXAM: PORTABLE CHEST 1 VIEW COMPARISON:  January 02, 2013 FINDINGS: Nipple shadows project over the lung bases. No pneumothorax. No suspicious nodules or masses. No focal infiltrates. The heart, hila, and mediastinum are normal. IMPRESSION: No active disease. Electronically Signed   By: Dorise Bullion III M.D   On: 09/07/2017 20:06    Cardiac Studies   Left heart cath pending  Echo 2014 Study Conclusions  - Left ventricle: The cavity size was normal. Systolic function was mildly reduced. The estimated ejection fraction was in the range of 45% to 50%. There is akinesis of the apical myocardium. Doppler parameters are consistent with abnormal left ventricular relaxation (grade 1 diastolic dysfunction). Acoustic contrast opacification revealed no evidence ofthrombus. - Aortic valve: Trivial  regurgitation.  Patient Profile     52 y.o. 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 She presented with chest pain and found to have an elevated troponin.   Assessment & Plan    1. NSTEMI - troponin peaked at 2.87 --> 4.91 --> 5.16 - EKG with TWI in inferior leads and mild ST elevations noted to not meet diagnostic criteria for STEMI - pt continues to have 2/10 pain on nitro drip, but does not want nitro titrated - will consider echocardiogram given her previously mild decrease in LVEF following her MI in 2014 - plan for left heart cath today   2. HTN - had recently been taken off of BP meds at the advice of her PCP - pressures here have been uncontrolled - will likely need to restart beta blocker and possibly ACEI/ARB   3. HLD - 06/15/2017: Cholesterol, Total 275; HDL 74; LDL Calculated 191; Triglycerides 48 - LDL goal is less than 70 - will increase lipitor that was started last night to 80 mg    For questions or updates, please contact [place] Please consult [email or web address] for contact info under Cardiology/STEMI.      Signed, Tami Lin Duke, PA  09/08/2017, 7:16 AM

## 2017-09-09 ENCOUNTER — Other Ambulatory Visit (HOSPITAL_COMMUNITY): Payer: BLUE CROSS/BLUE SHIELD

## 2017-09-09 ENCOUNTER — Encounter (HOSPITAL_COMMUNITY): Payer: Self-pay | Admitting: Physician Assistant

## 2017-09-09 LAB — CBC
HCT: 35.8 % — ABNORMAL LOW (ref 36.0–46.0)
HEMOGLOBIN: 11.1 g/dL — AB (ref 12.0–15.0)
MCH: 24.2 pg — ABNORMAL LOW (ref 26.0–34.0)
MCHC: 31 g/dL (ref 30.0–36.0)
MCV: 78.2 fL (ref 78.0–100.0)
Platelets: 174 10*3/uL (ref 150–400)
RBC: 4.58 MIL/uL (ref 3.87–5.11)
RDW: 19.9 % — AB (ref 11.5–15.5)
WBC: 9 10*3/uL (ref 4.0–10.5)

## 2017-09-09 LAB — BASIC METABOLIC PANEL
ANION GAP: 5 (ref 5–15)
BUN: 8 mg/dL (ref 6–20)
CALCIUM: 9 mg/dL (ref 8.9–10.3)
CO2: 26 mmol/L (ref 22–32)
Chloride: 102 mmol/L (ref 101–111)
Creatinine, Ser: 0.87 mg/dL (ref 0.44–1.00)
GFR calc Af Amer: >60 mL/min (ref >60)
GLUCOSE: 116 mg/dL — AB (ref 65–99)
Potassium: 3.6 mmol/L (ref 3.5–5.1)
SODIUM: 133 mmol/L — AB (ref 135–145)

## 2017-09-09 LAB — HEPATIC FUNCTION PANEL
ALT: 21 U/L (ref 14–54)
AST: 44 U/L — ABNORMAL HIGH (ref 15–41)
Albumin: 3.4 g/dL — ABNORMAL LOW (ref 3.5–5.0)
Alkaline Phosphatase: 55 U/L (ref 38–126)
Bilirubin, Direct: 0.2 mg/dL (ref 0.1–0.5)
Indirect Bilirubin: 1.5 mg/dL — ABNORMAL HIGH (ref 0.3–0.9)
TOTAL PROTEIN: 5.4 g/dL — AB (ref 6.5–8.1)
Total Bilirubin: 1.7 mg/dL — ABNORMAL HIGH (ref 0.3–1.2)

## 2017-09-09 MED ORDER — METOPROLOL TARTRATE 12.5 MG HALF TABLET
12.5000 mg | ORAL_TABLET | Freq: Two times a day (BID) | ORAL | Status: DC
  Administered 2017-09-09 – 2017-09-11 (×5): 12.5 mg via ORAL
  Filled 2017-09-09 (×6): qty 1

## 2017-09-09 MED FILL — Heparin Sod (Porcine)-NaCl IV Soln 1000 Unit/500ML-0.9%: INTRAVENOUS | Qty: 1000 | Status: AC

## 2017-09-09 MED FILL — Nitroglycerin IV Soln 100 MCG/ML in D5W: INTRA_ARTERIAL | Qty: 10 | Status: AC

## 2017-09-09 NOTE — Progress Notes (Addendum)
The patient has been seen in conjunction with Doreene Adas, PAC . All aspects of care have been considered and discussed. The patient has been personally interviewed, examined, and all clinical data has been reviewed.   Completed inferior wall infarction.  Reperfusion was not possible.  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  Since she did not reperfuse, will need observation for 36 additional hours with anticipated discharge on Sunday morning if no electrical or mechanical complications.  Continue beta-blocker therapy as tolerated by blood pressure, high intensity statin therapy, aspirin, and will assess LV function with 2D echocardiogram.  Transfer to telemetry floor as required by needs of 6c.  Progress Note  Patient Name: Karina Richards Date of Encounter: 09/09/2017  Primary Cardiologist: Sinclair Grooms, MD   Subjective   Pt denies further chest pain. Pt is ready for discharge.  Inpatient Medications    Scheduled Meds: . aspirin EC  81 mg Oral Daily  . atorvastatin  80 mg Oral q1800  . metoprolol tartrate  12.5 mg Oral BID  . sodium chloride flush  3 mL Intravenous Q12H  . sodium chloride flush  3 mL Intravenous Q12H   Continuous Infusions: . sodium chloride    . sodium chloride    . nitroGLYCERIN Stopped (09/08/17 1049)   PRN Meds: sodium chloride, sodium chloride, acetaminophen, nitroGLYCERIN, ondansetron (ZOFRAN) IV, sodium chloride flush, sodium chloride flush   Vital Signs    Vitals:   09/08/17 1845 09/08/17 1927 09/08/17 2000 09/09/17 0410  BP: 120/71 (!) 150/78  (!) 164/83  Pulse: 80 80  83  Resp: 10 16 16 16   Temp:  97.7 F (36.5 C)  98.2 F (36.8 C)  TempSrc:  Oral  Oral  SpO2: 100% 99%  100%  Weight:    131 lb 9.8 oz (59.7 kg)  Height:        Intake/Output Summary (Last 24 hours) at 09/09/2017 0636 Last data filed at 09/09/2017 0413 Gross per 24 hour  Intake 249 ml  Output 1950 ml  Net -1701 ml   Filed Weights   09/07/17 2338 09/08/17 0230 09/09/17 0410  Weight: 123 lb 10.9 oz (56.1 kg) 123 lb 10.9 oz (56.1 kg) 131 lb 9.8 oz (59.7 kg)    Telemetry    Sinus  - Personally Reviewed  ECG    Sinus, inferior Q waves - Personally Reviewed  Physical Exam   GEN: No acute distress.   Neck: No JVD Cardiac: RRR, no murmurs, rubs, or gallops. Right groin C/D/I without hematoma Respiratory: Clear to auscultation bilaterally. GI: Soft, nontender, non-distended  MS: No edema; No deformity. Neuro:  Nonfocal  Psych: Normal affect   Labs    Chemistry Recent Labs  Lab 09/07/17 1948 09/08/17 0719 09/09/17 0259  NA 134* 135 133*  K 4.1 3.9 3.6  CL 97* 101 102  CO2 26 24 26   GLUCOSE 103* 126* 116*  BUN 13 5* 8  CREATININE 0.89 0.81 0.87  CALCIUM 10.1 9.1 9.0  GFRNONAA >60 >60 >60  GFRAA >60 >60 >60  ANIONGAP 11 10 5      Hematology Recent Labs  Lab 09/08/17 0254 09/08/17 0719 09/09/17 0259  WBC 10.5 9.0 9.0  RBC 5.45* 5.11 4.58  HGB 13.1 12.6 11.1*  HCT 42.1 39.3 35.8*  MCV 77.2* 76.9* 78.2  MCH 24.0* 24.7* 24.2*  MCHC 31.1 32.1 31.0  RDW 19.9* 19.6* 19.9*  PLT 220 204 174    Cardiac Enzymes Recent Labs  Lab 09/07/17 1949 09/08/17  0254 09/08/17 0719 09/08/17 1355  TROPONINI 4.91* 5.16* 3.65* 3.61*    Recent Labs  Lab 09/07/17 1950  TROPIPOC 2.87*     BNPNo results for input(s): BNP, PROBNP in the last 168 hours.   DDimer No results for input(s): DDIMER in the last 168 hours.   Radiology    Dg Chest Portable 1 View  Result Date: 09/07/2017 CLINICAL DATA:  Left-sided chest pain.  Shortness of breath. EXAM: PORTABLE CHEST 1 VIEW COMPARISON:  January 02, 2013 FINDINGS: Nipple shadows project over the lung bases. No pneumothorax. No suspicious nodules or masses. No focal infiltrates. The heart, hila, and mediastinum are normal. IMPRESSION: No active disease. Electronically Signed   By: Dorise Bullion III M.D   On: 09/07/2017 20:06    Cardiac Studies   Left heart  cath 09/08/17:  Previously placed Mid LAD stent (unknown type) is widely patent.  RPDA lesion is 100% stenosed.  Prox LAD lesion is 20% stenosed.  The left ventricular ejection fraction is 55-65% by visual estimate.  The left ventricular systolic function is normal.  LV end diastolic pressure is normal.  There is no aortic valve stenosis.  Unable to cross the occlusion in the PDA, which appeared to be a spontaneous dissection.   Continue medical therapy.  Will stop anticoagulation.  Continue aspirin.   Patient Profile     52 y.o. 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 She presented with chest pain and found to have an elevated troponin. Left heart cath yesterday showed 100% occlusion of PDA with suspicion for SCAD.   Assessment & Plan    1. NSTEMI - troponin peaked at 5.16 - left heart cath yesterday with 100% occluded PDA, unable to cross, no intervention, appeared to be a spontaneous dissection - medical therapy was recommended. - she is on lopressor 12.5 mg BID, ASA and lipitor - LV appears normal, no need for echocardiogram - no further chest pain - will discuss with attending need for DAPT after second SCAD   2. HTN - pressures have been elevated this morning - nitro drip off since yesterday - on lopressor 12.5 mg BID - will monitor pressure this morning, if still elevated after lopressor, will add 5 mg lisinopril - stressed the importance of good pressure control following second occurrence of SCAD   3. HLD - 06/15/2017: Cholesterol, Total 275; HDL 74; LDL Calculated 191; Triglycerides 48 - on 80 mg lipitor - LFTs pending - will need repeat lipids and LFTs in 6 weeks   For questions or updates, please contact [facility] Please consult [email or web address] for contact info under Cardiology/STEMI.      Signed, Tami Lin Duke, PA  09/09/2017, 6:36 AM

## 2017-09-09 NOTE — Discharge Summary (Addendum)
Discharge Summary    Patient ID: Karina Richards,  MRN: [id number], DOB/AGE: April 21, 1965 52 y.o.  Admit date: 09/07/2017 Discharge date: 09/11/2017  Primary Care Provider: Manon Hilding Primary Cardiologist: Sinclair Grooms, MD  Discharge Diagnoses    Principal Problem:   NSTEMI (non-ST elevated myocardial infarction) [facility]) Active Problems:   Hyperlipidemia   Coronary atherosclerosis of native coronary artery   ACS (acute coronary syndrome) ([place])   Allergies No Known Allergies  Diagnostic Studies/Procedures    Left heart cath 09/08/17:  Previously placed Mid LAD stent (unknown type) is widely patent.  RPDA lesion is 100% stenosed.  Prox LAD lesion is 20% stenosed.  The left ventricular ejection fraction is 55-65% by visual estimate.  The left ventricular systolic function is normal.  LV end diastolic pressure is normal.  There is no aortic valve stenosis.  Unable to cross the occlusion in the PDA, which appeared to be a spontaneous dissection.  Continue medical therapy. Will stop anticoagulation. Continue aspirin.   Echo 09/10/17 Study Conclusions  - Left ventricle: The cavity size was normal. Wall thickness was   normal. Systolic function was normal. The estimated ejection   fraction was in the range of 60% to 65%. Wall motion was normal;   there were no regional wall motion abnormalities. The study is   not technically sufficient to allow evaluation of LV diastolic   function. - Aortic valve: Valve area (VTI): 2.23 cm^2. Valve area (Vmax):   2.12 cm^2. Valve area (Vmean): 2.2 cm^2. - Mitral valve: Valve area by pressure half-time: 1.57 cm^2. - Atrial septum: No defect or patent foramen ovale was identified. - Technically difficult study.    History of Present Illness     [name] is a 52 year old female with hx of CAD (stent to LAD in 2014), hypertension and hyperlipidemia presents to the hospital with complaints of left sided chest pain. The  patient reported that she has had chest pain for 2 days. It is associated with some shortness of breath. It is felt as a soreness on the left side. No radiation of the pain. She denies any nausea or vomiting or diaphoresis.  Back in 2014 she had similar chest pain but of greater intensity. She had a crushing sensation over her chest. A cardiac cath resulted in a stent placement to the LAD. She has done well since. Till her new episode of chest pain she was exercising regularly.  She has been compliant with the ASA. She however stopped her BP medications on the advice of her physician. She is not on a statin.  There is a strong family history of CAD. Her mother had a CABG in her 4s.  [name] reports significant stress recently. Her co-worker and good friend died unexpectedly a few days ago. Also her son (who is 55 years old) is starting chemotherapy for a malignancy.  Hospital Course     Consultants: none  NSTEMI She has a history of CAD vs SCAD with stent placed to her LAD in 2014. She presented with chest pain concerning for angina. Troponin peaked at 5.16. She was taken to the cath lab 09/08/17 which showed widely  Patent LAD stent and 100% occlusion of the PDA that was not amenable to stenting and showed evidence of spontaneous coronary artery dissection.  She tolerated the procedure well. She was observed for additional 36 hours. No recurrent pain. Right groin site is C/D/I without hematoma. Continue daily ASA and lopressor. LV  appeared normal during cath. Echo showed normal LEVF  HTN Pressure control will be very important for her. Pressures have been elevated since turning off nitro drip. However normalized on Lopressor 12.5 mg BID. If more pressure control is needed, will add 5 mg lisinopril as outpatient.   HLD 06/15/2017: Cholesterol, Total 275; HDL 74; LDL Calculated 191; Triglycerides 48 . LDL goal less than 70. Continue 80 mg lipitor. Will need repeat lipids and LFTs in 6  weeks.    Discharge Vitals Blood pressure 113/78, pulse 66, temperature 98.2 F (36.8 C), temperature source Oral, resp. rate 16, height 5\' 7"  (1.702 m), weight 121 lb 6.4 oz (55.1 kg), last menstrual period 09/08/2014, SpO2 100 %.  Filed Weights   09/08/17 0230 09/09/17 0410 09/10/17 0611  Weight: 123 lb 10.9 oz (56.1 kg) 131 lb 9.8 oz (59.7 kg) 121 lb 6.4 oz (55.1 kg)    Labs & Radiologic Studies    CBC Recent Labs    09/09/17 0259  WBC 9.0  HGB 11.1*  HCT 35.8*  MCV 78.2  PLT 423   Basic Metabolic Panel Recent Labs    09/09/17 0259  NA 133*  K 3.6  CL 102  CO2 26  GLUCOSE 116*  BUN 8  CREATININE 0.87  CALCIUM 9.0   Liver Function Tests Recent Labs    09/09/17 0259  AST 44*  ALT 21  ALKPHOS 55  BILITOT 1.7*  PROT 5.4*  ALBUMIN 3.4*   No results for input(s): LIPASE, AMYLASE in the last 72 hours. Cardiac Enzymes Recent Labs    09/08/17 1355  TROPONINI 3.61*    Ct Abdomen Pelvis W Contrast  Result Date: 08/13/2017 CLINICAL DATA:  Patient with prior hernia repair. Recent colonoscopy. Evaluate for possible gist tumor. EXAM: CT ABDOMEN AND PELVIS WITH CONTRAST TECHNIQUE: Multidetector CT imaging of the abdomen and pelvis was performed using the standard protocol following bolus administration of intravenous contrast. CONTRAST:  136mL ISOVUE-300 IOPAMIDOL (ISOVUE-300) INJECTION 61% COMPARISON:  CT abdomen pelvis 08/16/2014 FINDINGS: Lower chest: Normal heart size. Lung bases are clear. No pleural effusion. Hepatobiliary: Liver is normal in size and contour. No focal lesion identified. Gallbladder is unremarkable. No intrahepatic or extrahepatic biliary ductal dilatation. Pancreas: Unremarkable Spleen: Unremarkable Adrenals/Urinary Tract: Normal adrenal glands. Kidneys enhance symmetrically with contrast. No hydronephrosis. Stomach/Bowel: No abnormal bowel wall thickening or evidence for bowel obstruction. No free fluid or free intraperitoneal air. Normal  morphology of the stomach. No definite mass identified. Vascular/Lymphatic: Normal caliber abdominal aorta. Peripheral calcified atherosclerotic plaque. No retroperitoneal lymphadenopathy. Reproductive: Uterus and adnexal structures are unremarkable. Other: None. Musculoskeletal: No aggressive or acute appearing osseous lesions. Probable bone island right ilium (image 58; series 2). IMPRESSION: No definite mass involving the stomach. No acute process within the abdomen or pelvis. Electronically Signed   By: Lovey Newcomer M.D.   On: 08/13/2017 12:24   Dg Chest Portable 1 View  Result Date: 09/07/2017 CLINICAL DATA:  Left-sided chest pain.  Shortness of breath. EXAM: PORTABLE CHEST 1 VIEW COMPARISON:  January 02, 2013 FINDINGS: Nipple shadows project over the lung bases. No pneumothorax. No suspicious nodules or masses. No focal infiltrates. The heart, hila, and mediastinum are normal. IMPRESSION: No active disease. Electronically Signed   By: Dorise Bullion III M.D   On: 09/07/2017 20:06   Disposition   Pt is being discharged home today in good condition.  Follow-up Plans & Appointments    Follow-up Information    Ledora Bottcher, PA Follow up on  09/26/2017.   Specialties:  Physician Assistant, Cardiology, Radiology Why:  10:15 am for follow up Contact information: [address] [place] [address] [phone]          Discharge Instructions    AMB Referral to Cardiac Rehabilitation - Phase II   Complete by:  As directed    Diagnosis:   Other Comment - SCAD NSTEMI     Amb Referral to Cardiac Rehabilitation   Complete by:  As directed    Diagnosis:  NSTEMI   Diet - low sodium heart healthy   Complete by:  As directed    Discharge instructions   Complete by:  As directed    NO HEAVY LIFTING (>10lbs) X 2 WEEKS. NO SEXUAL ACTIVITY X 2 WEEKS. NO DRIVING X 3 DAYS NO SOAKING BATHS, HOT TUBS, POOLS, ETC., X 7 DAYS. YOU MAY GO TO WORK ON 09/14/17.   Increase activity  slowly   Complete by:  As directed       Discharge Medications   Allergies as of 09/11/2017   No Known Allergies     Medication List    TAKE these medications   aspirin 81 MG EC tablet Take 1 tablet (81 mg total) by mouth daily.   atorvastatin 80 MG tablet Commonly known as:  LIPITOR Take 1 tablet (80 mg total) by mouth daily at 6 PM.   Cholecalciferol Liqd Take 4,000 Units by mouth daily. Uses one drop on the tongue each morning   EMERGEN-C VITAMIN C Pack Take 1 Package by mouth daily.   metoprolol tartrate 25 MG tablet Commonly known as:  LOPRESSOR Take 0.5 tablets (12.5 mg total) by mouth 2 (two) times daily.   Milk Thistle 250 MG Caps Take 250 mg by mouth daily.   nitroGLYCERIN 0.4 MG SL tablet Commonly known as:  NITROSTAT Place 1 tablet (0.4 mg total) under the tongue every 5 (five) minutes x 3 doses as needed for chest pain.        Acute coronary syndrome (MI, NSTEMI, STEMI, etc) this admission?: Yes.     AHA/ACC Clinical Performance & Quality Measures: 1. Aspirin prescribed? - Yes 2. ADP Receptor Inhibitor (Plavix/Clopidogrel, Brilinta/Ticagrelor or Effient/Prasugrel) prescribed (includes medically managed patients)? - No - N/A 3. Beta Blocker prescribed? - Yes 4. High Intensity Statin (Lipitor 40-80mg  or Crestor 20-40mg ) prescribed? - Yes 5. EF assessed during THIS hospitalization? - Yes 6. For EF <40%, was ACEI/ARB prescribed? - Not Applicable (EF >/= 69%) 7. For EF <40%, Aldosterone Antagonist (Spironolactone or Eplerenone) prescribed? - No - Reason:  nl EF 8. Cardiac Rehab Phase II ordered (Included Medically managed Patients)? - Yes     Outstanding Labs/Studies   Pressure control Consider OP f/u labs 6-8 weeks given statin initiation this admission.  Duration of Discharge Encounter   Greater than 30 minutes including physician time.  Jarrett Soho, PA 09/11/2017, 9:43 AM

## 2017-09-09 NOTE — Progress Notes (Signed)
CARDIAC REHAB PHASE I   PRE:  Rate/Rhythm: 73 SR  BP:  Sitting: pt ambulating already      SaO2:  Pt ambulating already  MODE:  Ambulation: 700 ft   POST:  Rate/Rhythm: 67  BP:  Sitting: 138/76    SaO2: 99 RA   Pt ambulated 718ft in hallway independently with steady gait. Pt stating she has been under a lot of stress lately and thinks that was the cause of her initial chest pressure. Spent a great deal of time talking about stress, how it affects our bodies, and ways to cope. Heart healthy diet sheet given to pt along with MI booklet. Reviewed restrictions and exercise guidelines with pt. Pt very active and enjoys long hikes. Encouraged pt to take it easy on her body especially in the following week of d/c. Will refer to CRP II [facility].   [id number] Rufina Falco, RN BSN 09/09/2017 10:32 AM

## 2017-09-10 ENCOUNTER — Inpatient Hospital Stay (HOSPITAL_COMMUNITY): Payer: BLUE CROSS/BLUE SHIELD

## 2017-09-10 DIAGNOSIS — I214 Non-ST elevation (NSTEMI) myocardial infarction: Secondary | ICD-10-CM

## 2017-09-10 LAB — ECHOCARDIOGRAM COMPLETE
HEIGHTINCHES: 67 in
Weight: 1942.4 oz

## 2017-09-10 NOTE — Progress Notes (Signed)
Progress Note  Patient Name: Karina Richards Date of Encounter: 09/10/2017  Primary Cardiologist: Sinclair Grooms, MD   Subjective   No complaints  Inpatient Medications    Scheduled Meds: . aspirin EC  81 mg Oral Daily  . atorvastatin  80 mg Oral q1800  . metoprolol tartrate  12.5 mg Oral BID  . sodium chloride flush  3 mL Intravenous Q12H  . sodium chloride flush  3 mL Intravenous Q12H   Continuous Infusions: . sodium chloride    . sodium chloride    . nitroGLYCERIN Stopped (09/08/17 1049)   PRN Meds: sodium chloride, sodium chloride, acetaminophen, nitroGLYCERIN, ondansetron (ZOFRAN) IV, sodium chloride flush, sodium chloride flush   Vital Signs    Vitals:   09/09/17 1253 09/09/17 1621 09/09/17 1947 09/10/17 0611  BP: (!) 151/84 (!) 143/80 119/70 111/64  Pulse: 66 68 79 (!) 58  Resp: 16 16 20 16   Temp: 98.3 F (36.8 C) 98.1 F (36.7 C) 98 F (36.7 C) 97.9 F (36.6 C)  TempSrc: Oral Oral Oral Oral  SpO2: 100% 100% 100% 100%  Weight:    121 lb 6.4 oz (55.1 kg)  Height:       No intake or output data in the 24 hours ending 09/10/17 1028 Filed Weights   09/08/17 0230 09/09/17 0410 09/10/17 0611  Weight: 123 lb 10.9 oz (56.1 kg) 131 lb 9.8 oz (59.7 kg) 121 lb 6.4 oz (55.1 kg)    Telemetry    NSR - Personally Reviewed  ECG    na  Physical Exam   GEN: No acute distress.   Neck: No JVD Cardiac: RRR, no murmurs, rubs, or gallops.  Respiratory: Clear to auscultation bilaterally. GI: Soft, nontender, non-distended  MS: No edema; No deformity. Neuro:  Nonfocal  Psych: Normal affect   Labs    Chemistry Recent Labs  Lab 09/07/17 1948 09/08/17 0719 09/09/17 0259  NA 134* 135 133*  K 4.1 3.9 3.6  CL 97* 101 102  CO2 26 24 26   GLUCOSE 103* 126* 116*  BUN 13 5* 8  CREATININE 0.89 0.81 0.87  CALCIUM 10.1 9.1 9.0  PROT  --   --  5.4*  ALBUMIN  --   --  3.4*  AST  --   --  44*  ALT  --   --  21  ALKPHOS  --   --  55  BILITOT  --   --  1.7*    GFRNONAA >60 >60 >60  GFRAA >60 >60 >60  ANIONGAP 11 10 5      Hematology Recent Labs  Lab 09/08/17 0254 09/08/17 0719 09/09/17 0259  WBC 10.5 9.0 9.0  RBC 5.45* 5.11 4.58  HGB 13.1 12.6 11.1*  HCT 42.1 39.3 35.8*  MCV 77.2* 76.9* 78.2  MCH 24.0* 24.7* 24.2*  MCHC 31.1 32.1 31.0  RDW 19.9* 19.6* 19.9*  PLT 220 204 174    Cardiac Enzymes Recent Labs  Lab 09/07/17 1949 09/08/17 0254 09/08/17 0719 09/08/17 1355  TROPONINI 4.91* 5.16* 3.65* 3.61*    Recent Labs  Lab 09/07/17 1950  TROPIPOC 2.87*     BNPNo results for input(s): BNP, PROBNP in the last 168 hours.   DDimer No results for input(s): DDIMER in the last 168 hours.   Radiology    No results found.  Cardiac Studies   08/2017 cath  Previously placed Mid LAD stent (unknown type) is widely patent.  RPDA lesion is 100% stenosed.  Prox LAD lesion is  20% stenosed.  The left ventricular ejection fraction is 55-65% by visual estimate.  The left ventricular systolic function is normal.  LV end diastolic pressure is normal.  There is no aortic valve stenosis.  Unable to cross the occlusion in the PDA, which appeared to be a spontaneous dissection.   Patient Profile     52 y.o. 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 She presented with chest pain and found to have an elevated troponin.Left heart cath yesterday showed 100% occlusion of PDA with suspicion for SCAD.     Assessment & Plan    1. 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  - cath this admit with occluded PDA, suspicion of SCAD. Unable to cross with wire, managed medically. There are collaterals tht e PDA.  - peak trop 5.1. Echo pending today, LVEF by LVgram 55-65% - from Dr Thompson Caul note since did not reprefuse recs for 36 hrs of obs, if no electricla or mechanical complications ok for discharge  Sunday morning - medical therapy with ASA 81, atorva 80, lopressor 12.5mg  bid. There is no clear consensus on DAPT in setting of SCAD, particularly in the absence of PCI. ASA 81mg  alone is ok in this setting. - continue beta blocker   2. HTN - at goal, continue current meds   3. Hyperlipidemia - continue statin, last LDL 190.    Monitor overnight, if no issues plan for discharge tomorrow morning.   For questions or updates, please contact [facility] Please consult [email or web address] for contact info under Cardiology/STEMI.      Merrily Pew, MD  09/10/2017, 10:28 AM

## 2017-09-10 NOTE — Progress Notes (Signed)
  Echocardiogram 2D Echocardiogram has been performed.  Karina Richards 09/10/2017, 3:52 PM

## 2017-09-10 NOTE — Progress Notes (Signed)
CARDIAC REHAB PHASE I   PRE:  Rate/Rhythm: 64 SR  BP:  Supine:   Sitting: 131/76  Standing:    SaO2: 100 RA  MODE:  Ambulation: 2000 ft   POST:  Rate/Rhythm: 72 SR  BP:  Supine:   Sitting: 113/79  Standing:    SaO2: 100 RA 0915-1010 Pt tolerated ambulation well without c/o of cp or SOB. VS stable. Answered pt's questions related to information provided yesterday. We discussed stress and ways to handle it better. Attempted to place MI a way forward video on for her but system not working.Pt to recliner after walk with call light in reach.  Rodney Langton RN 09/10/2017 10:09 AM

## 2017-09-11 MED ORDER — METOPROLOL TARTRATE 25 MG PO TABS: 12.5000 mg | ORAL_TABLET | Freq: Two times a day (BID) | ORAL | 6 refills | Status: DC

## 2017-09-11 MED ORDER — NITROGLYCERIN 0.4 MG SL SUBL: 0.4000 mg | SUBLINGUAL_TABLET | SUBLINGUAL | 12 refills | Status: AC | PRN

## 2017-09-11 MED ORDER — ATORVASTATIN CALCIUM 80 MG PO TABS: 80.0000 mg | ORAL_TABLET | Freq: Every day | ORAL | 6 refills | Status: DC

## 2017-09-11 NOTE — Plan of Care (Signed)
Pt. able to rest in intervals thru out the night. Even and unlabored breathing. Telemetry monitoring in place. VS stable. No acute distress noted. Hourly rounding completed. Bed locked and low. Call bell within reach. Bedside shift report will be given to oncoming nurse.

## 2017-09-11 NOTE — Progress Notes (Signed)
Progress Note  Patient Name: Karina Richards Date of Encounter: 09/11/2017  Primary Cardiologist: Sinclair Grooms, MD   Subjective   No complaints  Inpatient Medications    Scheduled Meds: . aspirin EC  81 mg Oral Daily  . atorvastatin  80 mg Oral q1800  . metoprolol tartrate  12.5 mg Oral BID  . sodium chloride flush  3 mL Intravenous Q12H  . sodium chloride flush  3 mL Intravenous Q12H   Continuous Infusions: . sodium chloride    . sodium chloride     PRN Meds: sodium chloride, sodium chloride, acetaminophen, nitroGLYCERIN, ondansetron (ZOFRAN) IV, sodium chloride flush, sodium chloride flush   Vital Signs    Vitals:   09/09/17 1947 09/10/17 0611 09/10/17 1321 09/10/17 1928  BP: 119/70 111/64 124/77 113/78  Pulse: 79 (!) 58 (!) 56 66  Resp: 20 16  16   Temp: 98 F (36.7 C) 97.9 F (36.6 C) 97.7 F (36.5 C) 98.2 F (36.8 C)  TempSrc: Oral Oral Oral Oral  SpO2: 100% 100% 100% 100%  Weight:  121 lb 6.4 oz (55.1 kg)    Height:        Intake/Output Summary (Last 24 hours) at 09/11/2017 0815 Last data filed at 09/10/2017 1000 Gross per 24 hour  Intake 220 ml  Output -  Net 220 ml   Filed Weights   09/08/17 0230 09/09/17 0410 09/10/17 0611  Weight: 123 lb 10.9 oz (56.1 kg) 131 lb 9.8 oz (59.7 kg) 121 lb 6.4 oz (55.1 kg)    Telemetry    SR - Personally Reviewed  ECG    na  Physical Exam   GEN: No acute distress.   Neck: No JVD Cardiac: RRR, no murmurs, rubs, or gallops.  Respiratory: Clear to auscultation bilaterally. GI: Soft, nontender, non-distended  MS: No edema; No deformity. Neuro:  Nonfocal  Psych: Normal affect   Labs    Chemistry Recent Labs  Lab 09/07/17 1948 09/08/17 0719 09/09/17 0259  NA 134* 135 133*  K 4.1 3.9 3.6  CL 97* 101 102  CO2 26 24 26   GLUCOSE 103* 126* 116*  BUN 13 5* 8  CREATININE 0.89 0.81 0.87  CALCIUM 10.1 9.1 9.0  PROT  --   --  5.4*  ALBUMIN  --   --  3.4*  AST  --   --  44*  ALT  --   --  21    ALKPHOS  --   --  55  BILITOT  --   --  1.7*  GFRNONAA >60 >60 >60  GFRAA >60 >60 >60  ANIONGAP 11 10 5      Hematology Recent Labs  Lab 09/08/17 0254 09/08/17 0719 09/09/17 0259  WBC 10.5 9.0 9.0  RBC 5.45* 5.11 4.58  HGB 13.1 12.6 11.1*  HCT 42.1 39.3 35.8*  MCV 77.2* 76.9* 78.2  MCH 24.0* 24.7* 24.2*  MCHC 31.1 32.1 31.0  RDW 19.9* 19.6* 19.9*  PLT 220 204 174    Cardiac Enzymes Recent Labs  Lab 09/07/17 1949 09/08/17 0254 09/08/17 0719 09/08/17 1355  TROPONINI 4.91* 5.16* 3.65* 3.61*    Recent Labs  Lab 09/07/17 1950  TROPIPOC 2.87*     BNPNo results for input(s): BNP, PROBNP in the last 168 hours.   DDimer No results for input(s): DDIMER in the last 168 hours.   Radiology    No results found.  Cardiac Studies     Patient Profile     52 y.o.femalewith a  history of CAD (LAD stent in 2014 with possible SCAD following acute anteroapical MI), HTN, HLD, and mildly reduced EF (45-50%). She presented with chest pain and found to have an elevated troponin.Left heart cath yesterdayshowed 100% occlusion of PDA with suspicion for SCAD.    Assessment & Plan   1. 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  - cath this admit with occluded PDA, suspicion of SCAD. Unable to cross with wire, managed medically. There are collaterals tht e PDA.  - peak trop 5.1. Echo with LVEF 60-65%, no WMAs - from Dr Thompson Caul note since did not reprefuse recs for 36 hrs of obs, if no electricla or mechanical complications ok for discharge Sunday morning - medical therapy with ASA 81, atorva 80, lopressor 12.5mg  bid. There is no clear consensus on DAPT in setting of SCAD, particularly in the absence of PCI. ASA 81mg  alone is ok in this setting.  - continue current meds, plan for discharge today    2. HTN - at goal, continue current meds   3. Hyperlipidemia - continue statin, last LDL 190.       For questions or updates,  please contact [place] Please consult [email or web address] for contact info under Cardiology/STEMI.      Merrily Pew, MD  09/11/2017, 8:15 AM

## 2017-09-11 NOTE — Progress Notes (Signed)
Pt d/c home per MD order, pt tol well, pt VSS, pt verbalized understanding of d/c, family at [facility], all questions answered

## 2017-09-13 ENCOUNTER — Encounter: Payer: Self-pay | Admitting: *Deleted

## 2017-09-21 ENCOUNTER — Telehealth: Payer: Self-pay | Admitting: Interventional Cardiology

## 2017-09-21 NOTE — Telephone Encounter (Signed)
Pt came into office dropped off a [employer] paper. Gave to [place] to see if Dr.Smith will go ahead and complete.

## 2017-09-25 NOTE — Progress Notes (Signed)
Cardiology Office Note:    Date:  09/26/2017   ID:  [name], DOB [birth year], MRN [id number]  PCP:  Manon Hilding, MD  Cardiologist:  Sinclair Grooms, MD   Referring MD: Manon Hilding, MD   Chief Complaint  Patient presents with  . Hospitalization Follow-up    NSTEMI    History of Present Illness:    Karina Richards is a 52 y.o. female with a hx of chronic diastolic heart failure, ischemic cardiomyopathy, HLD, CAD vs SCAD with stent placed to LAD in 2014, with recent NSTEMI. 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  She was observed for an additional 36 hrs after heart cath. She was discharged on an anti-hypertensive regimen for aggressive blood pressure control.  She returns today for [facility] appt as follow up. She is here alone. Since discharge, she has done well until this past weekend.  Last week it during the weekdays, she enjoys hiking and biking and long walk without chest pain, shortness of breath, claudication, palpitation, syncope.  However on Friday and Saturday, she did multiple hours of housework (5 to 6 hours of housework per day).  She continues to be stressed about her son new cancer diagnosis.  She is also still grieving the sudden death of her boss.  She reports chest pressure rated as a 1 out of 10 when she rests after doing 6 hours of housework.  She denies any kind of chest pain or chest pressure while hiking, biking, walking, and during her house chores.  She only gets the chest pressure after being in the hospital day and resting after her chores.  She denies palpitations and shortness of breath.  The chest pressure is located on the left side of her chest and is similar in location as her chest pain when she had her NSTEMI.  Specifically, Friday 09/23/17, she woke up that morning feeling bad and did not  sleep on Friday night she had chest pressure that started at approximately 5 PM and lasted until 3 AM.,  Rather it was constant in nature but only rated as a 1/10. She reported feeling very anxious that night and describes rumination.  On Saturday, she woke up feeling good and worked around the house for 6 hours.  After the housework when she was resting on the couch, she had a recurrence of the chest pressure.  Chest pressure again was rated as a 1 out of 10 and relieved with deep breathing.  She has chest pressure when she stopps doing activities and does not have chest pressure while doing activities (house chores, hiking, and biking). She is very active and feels very well when out in nature and feels generally ill when stuck indoors and doing housework. She is compliant on all medications and pressure is well controlled.  EKG today with T wave inversion in inferior leads V3, V4, V5; Q waves inferiorly.  She is leaving for a vacation with her daughter in [place] next week and is concerned about her recent fatigue.  She states that she just feels rundown and fatigued.  Although she denies having these feelings of fatigue earlier in the week when she was active outdoors.  Past Medical History:  Diagnosis Date  . Adnexal mass 2016   resolved by ultrasound 07/29/16.  Marland Kitchen CHF (congestive heart failure) (  [place])   . Complication of anesthesia    " It makes me crazy "  . Coronary artery disease   . Herpes simplex type 1 infection   . Myocardial infarction [facility])    2014 LAD SCAD, 09/08/17 100% occluded PDA suspect SCAD no intervention    Past Surgical History:  Procedure Laterality Date  . CORONARY STENT INTERVENTION  12/31/2012  . INGUINAL HERNIA REPAIR Right 2008  . LEFT HEART CATH AND CORONARY ANGIOGRAPHY N/A 09/08/2017   Procedure: LEFT HEART CATH AND CORONARY ANGIOGRAPHY;  Surgeon: Jettie Booze, MD;  Location: [facility];  Service: Cardiovascular;  Laterality: N/A;  . LEFT HEART  CATHETERIZATION WITH CORONARY ANGIOGRAM N/A 12/31/2012   LAD DES    Current Medications: Current Meds  Medication Sig  . aspirin EC 81 MG EC tablet Take 1 tablet (81 mg total) by mouth daily.  Marland Kitchen atorvastatin (LIPITOR) 80 MG tablet Take 1 tablet (80 mg total) by mouth daily at 6 PM.  . Cholecalciferol LIQD Take 4,000 Units by mouth daily. Uses one drop on the tongue each morning  . metoprolol tartrate (LOPRESSOR) 25 MG tablet Take 0.5 tablets (12.5 mg total) by mouth 2 (two) times daily.  . Multiple Vitamins-Minerals (EMERGEN-C VITAMIN C) PACK Take 1 Package by mouth daily.  . nitroGLYCERIN (NITROSTAT) 0.4 MG SL tablet Place 1 tablet (0.4 mg total) under the tongue every 5 (five) minutes x 3 doses as needed for chest pain.     Allergies:   Patient has no known allergies.   Social History   Socioeconomic History  . Marital status: Married    Spouse name: Not on file  . Number of children: Not on file  . Years of education: Not on file  . Highest education level: Not on file  Occupational History  . Not on file  Social Needs  . Financial resource strain: Not on file  . Food insecurity:    Worry: Not on file    Inability: Not on file  . Transportation needs:    Medical: Not on file    Non-medical: Not on file  Tobacco Use  . Smoking status: Never Smoker  . Smokeless tobacco: Never Used  Substance and Sexual Activity  . Alcohol use: Yes    Alcohol/week: 2.4 oz    Types: 4 Standard drinks or equivalent per week  . Drug use: No  . Sexual activity: Yes    Partners: Male    Birth control/protection: Post-menopausal  Lifestyle  . Physical activity:    Days per week: Not on file    Minutes per session: Not on file  . Stress: Not on file  Relationships  . Social connections:    Talks on phone: Not on file    Gets together: Not on file    Attends religious service: Not on file    Active member of club or organization: Not on file    Attends meetings of clubs or  organizations: Not on file    Relationship status: Not on file  Other Topics Concern  . Not on file  Social History Narrative  . Not on file     Family History: The patient's family history includes COPD in her mother; Heart attack in her maternal grandmother; Heart disease in her father and mother; Osteoporosis in her mother; Testicular cancer in her son.  ROS:   Please see the history of present illness.     All other systems reviewed and are negative.  EKGs/Labs/Other Studies Reviewed:    The following studies were reviewed today:  Left heart cath 09/08/17:  Previously placed Mid LAD stent (unknown type) is widely patent.  RPDA lesion is 100% stenosed.  Prox LAD lesion is 20% stenosed.  The left ventricular ejection fraction is 55-65% by visual estimate.  The left ventricular systolic function is normal.  LV end diastolic pressure is normal.  There is no aortic valve stenosis.  Unable to cross the occlusion in the PDA, which appeared to be a spontaneous dissection.  Continue medical therapy. Will stop anticoagulation. Continue aspirin.  Echo 09/10/17 Study Conclusions  - Left ventricle: The cavity size was normal. Wall thickness was normal. Systolic function was normal. The estimated ejection fraction was in the range of 60% to 65%. Wall motion was normal; there were no regional wall motion abnormalities. The study is not technically sufficient to allow evaluation of LV diastolic function. - Aortic valve: Valve area (VTI): 2.23 cm^2. Valve area (Vmax): 2.12 cm^2. Valve area (Vmean): 2.2 cm^2. - Mitral valve: Valve area by pressure half-time: 1.57 cm^2. - Atrial septum: No defect or patent foramen ovale was identified. - Technically difficult study.   EKG:  EKG is ordered today.  The ekg ordered today demonstrates sinus rhythm, Q waves and TWI inferior leads, TWI V3/4/5  Recent Labs: 06/15/2017: TSH 1.250 09/09/2017: ALT 21; BUN 8;  Creatinine, Ser 0.87; Hemoglobin 11.1; Platelets 174; Potassium 3.6; Sodium 133  Recent Lipid Panel    Component Value Date/Time   CHOL 275 (H) 06/15/2017 1407   TRIG 48 06/15/2017 1407   HDL 74 06/15/2017 1407   CHOLHDL 3.7 06/15/2017 1407   CHOLHDL 4.8 06/09/2016 1200   VLDL 17 06/09/2016 1200   LDLCALC 191 (H) 06/15/2017 1407   LDLDIRECT 202.3 03/27/2013 1412    Physical Exam:    VS:  BP 114/74   Pulse 64   Ht 5\' 7"  (1.702 m)   Wt 123 lb (55.8 kg)   LMP 09/08/2014   BMI 19.26 kg/m     Wt Readings from Last 3 Encounters:  09/26/17 123 lb (55.8 kg)  09/10/17 121 lb 6.4 oz (55.1 kg)  07/29/17 129 lb 12.8 oz (58.9 kg)     GEN: Well nourished, well developed in no acute distress HEENT: Normal NECK: No JVD; No carotid bruits LYMPHATICS: No lymphadenopathy CARDIAC: RRR, no murmurs, rubs, gallops, right groin access site C/D/I without hematoma or signs of infection RESPIRATORY:  Clear to auscultation without rales, wheezing or rhonchi  ABDOMEN: Soft, non-tender, non-distended MUSCULOSKELETAL:  No edema; No deformity  SKIN: Warm and dry NEUROLOGIC:  Alert and oriented x 3 PSYCHIATRIC:  Normal affect   ASSESSMENT:    1. NSTEMI (non-ST elevated myocardial infarction) ([place])   2. ACS (acute coronary syndrome) (H[facility])   3. Atherosclerosis of native coronary artery of native heart without angina pectoris   4. Old MI (myocardial infarction)   5. Hyperlipidemia, unspecified hyperlipidemia type    PLAN:    In order of problems listed above:  NSTEMI (non-ST elevated myocardial infarction) [facility]) ACS (acute coronary syndrome) ([place]) Atherosclerosis of native coronary artery of native heart without angina pectoris Old MI (myocardial infarction) - Plan: EKG 12-Lead, CBC, TSH, Basic Metabolic Panel (BMET), Magnesium Patient describes atypical chest pain that is not consistent with angina.  He tends to get chest pressure rated as a 1-10 while at rest, relieved with deep breathing,  and is constant in nature.  She does not report chest pain or pressure  with activities such as house chores, hiking, and biking.  However, EKG changes compared to EKGs obtained during her hospitalization.  Case discussed with Dr. Claiborne Billings (D[facility]).  After reviewing serial EKGs, it was thought that these EKG changes are consistent with the natural evolution of an NSTEMI and blockage in the PDA that was not amenable to PCI.  Dr. Claiborne Billings recommended adding 75 mg of Plavix and 15 mg of Imdur nightly to her current regimen.  She is cleared to return to work.  She is also cleared for cardiac rehab.  I have a strong suspicion that her chest pressure is consistent with overall anxiety.  I suggested that she try counseling to cope with her ongoing responsibilities at home, son's cancer diagnosis, and the sudden death of her boss.  Will check a CBC prior to starting plavix. She reports fatigue. Will check BMP, TSH, and Mg.   Hyperlipidemia, unspecified hyperlipidemia type 06/15/2017: Cholesterol, Total 275; HDL 74; LDL Calculated 191; Triglycerides 48 Continue statin. Will check fasting lipids at next visit.   I would like to see her back as soon as she returns from her vacation with her daughter and moving her to college. Will need a repeat EKG and fasting lipid profile.    Medication Adjustments/Labs and Tests Ordered: Current medicines are reviewed at length with the patient today.  Concerns regarding medicines are outlined above.  Orders Placed This Encounter  Procedures  . CBC  . TSH  . Basic Metabolic Panel (BMET)  . Magnesium  . EKG 12-Lead   Meds ordered this encounter  Medications  . clopidogrel (PLAVIX) 75 MG tablet    Sig: Take 1 tablet (75 mg total) by mouth daily.    Dispense:  30 tablet    Refill:  6  . isosorbide mononitrate (IMDUR) 30 MG 24 hr tablet    Sig: Take 0.5 tablets (15 mg total) by mouth daily.    Dispense:  30 tablet    Refill:  5    Signed, Ledora Bottcher, [place]    09/26/2017 3:25 PM    [facility]

## 2017-09-26 ENCOUNTER — Telehealth: Payer: Self-pay | Admitting: Interventional Cardiology

## 2017-09-26 ENCOUNTER — Ambulatory Visit: Payer: BLUE CROSS/BLUE SHIELD | Admitting: Physician Assistant

## 2017-09-26 ENCOUNTER — Encounter: Payer: Self-pay | Admitting: Physician Assistant

## 2017-09-26 VITALS — BP 114/74 | HR 64 | Ht 67.0 in | Wt 123.0 lb

## 2017-09-26 DIAGNOSIS — I252 Old myocardial infarction: Secondary | ICD-10-CM

## 2017-09-26 DIAGNOSIS — I214 Non-ST elevation (NSTEMI) myocardial infarction: Secondary | ICD-10-CM | POA: Diagnosis not present

## 2017-09-26 DIAGNOSIS — I2510 Atherosclerotic heart disease of native coronary artery without angina pectoris: Secondary | ICD-10-CM | POA: Diagnosis not present

## 2017-09-26 DIAGNOSIS — I249 Acute ischemic heart disease, unspecified: Secondary | ICD-10-CM

## 2017-09-26 DIAGNOSIS — E785 Hyperlipidemia, unspecified: Secondary | ICD-10-CM

## 2017-09-26 MED ORDER — ISOSORBIDE MONONITRATE ER 30 MG PO TB24: 15.0000 mg | ORAL_TABLET | Freq: Every day | ORAL | 5 refills | Status: DC

## 2017-09-26 MED ORDER — CLOPIDOGREL BISULFATE 75 MG PO TABS: 75.0000 mg | ORAL_TABLET | Freq: Every day | ORAL | 6 refills | Status: DC

## 2017-09-26 NOTE — Patient Instructions (Addendum)
Medication Instructions:  START Plavis 75mg  take 1 tablet once a day START Imdur take half 30mg  tablet daily at bedtime  Labwork: Your physician recommends that you return for lab work in: [employer], BMET, TSH, MAG  Testing/Procedures: NONE  Follow-Up: Your physician recommends that you schedule a follow-up appointment in: NEEDS APPT ON 7/22 PER ANGIE DUKE, PA  Any Other Special Instructions Will Be Listed Below (If Applicable). Work note given  If you need a refill on your cardiac medications before your next appointment, please call your pharmacy.

## 2017-09-26 NOTE — Telephone Encounter (Signed)
Left patient voicemail to call back. Her [employer] paperwork is available for pick up.

## 2017-09-27 ENCOUNTER — Ambulatory Visit: Payer: BLUE CROSS/BLUE SHIELD | Admitting: Physician Assistant

## 2017-09-27 LAB — BASIC METABOLIC PANEL
BUN/Creatinine Ratio: 12 (ref 9–23)
BUN: 10 mg/dL (ref 6–24)
CALCIUM: 9.9 mg/dL (ref 8.7–10.2)
CO2: 25 mmol/L (ref 20–29)
Chloride: 95 mmol/L — ABNORMAL LOW (ref 96–106)
Creatinine, Ser: 0.85 mg/dL (ref 0.57–1.00)
GFR calc Af Amer: 90 mL/min/{1.73_m2} (ref >59)
GFR, EST NON AFRICAN AMERICAN: 78 mL/min/{1.73_m2} (ref >59)
GLUCOSE: 75 mg/dL (ref 65–99)
POTASSIUM: 4.8 mmol/L (ref 3.5–5.2)
Sodium: 144 mmol/L (ref 134–144)

## 2017-09-27 LAB — CBC
HEMATOCRIT: 44.2 % (ref 34.0–46.6)
Hemoglobin: 13.8 g/dL (ref 11.1–15.9)
MCH: 25.4 pg — ABNORMAL LOW (ref 26.6–33.0)
MCHC: 31.2 g/dL — ABNORMAL LOW (ref 31.5–35.7)
MCV: 81 fL (ref 79–97)
PLATELETS: 334 10*3/uL (ref 150–450)
RBC: 5.44 x10E6/uL — AB (ref 3.77–5.28)
RDW: 18.1 % — ABNORMAL HIGH (ref 12.3–15.4)
WBC: 8.3 10*3/uL (ref 3.4–10.8)

## 2017-09-27 LAB — MAGNESIUM: MAGNESIUM: 2.4 mg/dL — AB (ref 1.6–2.3)

## 2017-09-27 LAB — TSH: TSH: 1.17 u[IU]/mL (ref 0.450–4.500)

## 2017-10-18 ENCOUNTER — Encounter: Payer: Self-pay | Admitting: Physician Assistant

## 2017-10-18 ENCOUNTER — Ambulatory Visit: Payer: BLUE CROSS/BLUE SHIELD | Admitting: Physician Assistant

## 2017-10-18 ENCOUNTER — Encounter: Payer: Self-pay | Admitting: Certified Nurse Midwife

## 2017-10-18 VITALS — BP 120/80 | HR 58 | Ht 67.0 in | Wt 129.8 lb

## 2017-10-18 DIAGNOSIS — I2542 Coronary artery dissection: Secondary | ICD-10-CM

## 2017-10-18 DIAGNOSIS — E785 Hyperlipidemia, unspecified: Secondary | ICD-10-CM | POA: Diagnosis not present

## 2017-10-18 DIAGNOSIS — I5032 Chronic diastolic (congestive) heart failure: Secondary | ICD-10-CM | POA: Diagnosis not present

## 2017-10-18 NOTE — Patient Instructions (Signed)
Medication Instructions:    STOP TAKING PLAVIX AND IMDUR   If you need a refill on your cardiac medications before your next appointment, please call your pharmacy.  Labwork: RETURN FOR FASTING LIPID AD LIVER LABS  IN  6  TO  8  WEEKS   Testing/Procedures: NONE ORDERED  TODAY    Follow-Up:  IN 3 TO 4 MONTHS WITH DR Tamala Julian    Any Other Special Instructions Will Be Listed Below (If Applicable).

## 2017-10-18 NOTE — Progress Notes (Signed)
Cardiology Office Note    Date:  10/18/2017   ID:  Karina Richards, DOB 02/20/1966, MRN [id number]  PCP:  Manon Hilding, MD  Cardiologist:  Dr. Tamala Julian  Chief Complaint  Patient presents with  . Follow-up    seen for Dr. Tamala Julian    History of Present Illness:  Karina Richards is a 52 y.o. 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  Patient recently was admitted to the hospital in June 2019 for NSTEMI.  Troponin peaked at 5.16.  Cardiac catheterization performed on 09/09/2018 2019 showed 100% RPDA occlusion, 20% proximal LAD stenosis, widely patent mid LAD stent, no significant aortic valve stenosis, EF 55 to 65%.  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  Medical therapy was recommended.  Echocardiogram performed on 09/10/2017 showed EF 60 to 65%, no regional wall motion abnormality, no significant valvular issue.  Patient was last seen by Fabian Sharp PA-C on 09/26/2017, at which time she mentioned she was having some occasional chest discomfort at rest, however they do not occur with physical activity.  The case was discussed with Dr. Claiborne Billings DOD at the time, 75 mg daily of Plavix and 15 mg daily of Imdur was added to her medical regimen.   Patient presents to cardiology office visit today.  She just returned from her trip to [place].  She was able to climb 6 to 7 miles on a daily basis in the [place].  She denies any recent chest pain or shortness of breath.  She has some intolerance to the Imdur and Plavix treated from the last office visit, therefore has not been taking the medication for the past 2 weeks.  Since she is doing so well, I am fine with her off of the Plavix and Imdur given the fact that she did not receive any stent during the recent cardiac catheterization.  She has  no lower extremity edema, orthopnea or PND.  She can follow-up with Dr. Tamala Julian in 3 to 66-month.  She is due for fasting lipid panel and LFT in 6 to 8 weeks.  Her previous LDL was 192 and total cholesterol was 275.  She has been compliant with her Lipitor.   Past Medical History:  Diagnosis Date  . Adnexal mass 2016   resolved by ultrasound 07/29/16.  Marland Kitchen CHF (congestive heart failure) ([place])   . Complication of anesthesia    " It makes me crazy "  . Coronary artery disease   . Herpes simplex type 1 infection   . Myocardial infarction [facility])    2014 LAD SCAD, 09/08/17 100% occluded PDA suspect SCAD no intervention    Past Surgical History:  Procedure Laterality Date  . CORONARY STENT INTERVENTION  12/31/2012  . INGUINAL HERNIA REPAIR Right 2008  . LEFT HEART CATH AND CORONARY ANGIOGRAPHY N/A 09/08/2017   Procedure: LEFT HEART CATH AND CORONARY ANGIOGRAPHY;  Surgeon: Jettie Booze, MD;  Location: [place] [facility];  Service: Cardiovascular;  Laterality: N/A;  . LEFT HEART CATHETERIZATION WITH CORONARY ANGIOGRAM N/A 12/31/2012   LAD DES    Current Medications: Outpatient Medications Prior to Visit  Medication Sig Dispense Refill  . aspirin EC 81 MG EC tablet Take 1 tablet (81 mg total) by mouth daily.    Marland Kitchen atorvastatin (LIPITOR) 80 MG tablet  Take 1 tablet (80 mg total) by mouth daily at 6 PM. 30 tablet 6  . Cholecalciferol LIQD Take 4,000 Units by mouth daily. Uses one drop on the tongue each morning    . metoprolol tartrate (LOPRESSOR) 25 MG tablet Take 0.5 tablets (12.5 mg total) by mouth 2 (two) times daily. 30 tablet 6  . Multiple Vitamins-Minerals (EMERGEN-C VITAMIN C) PACK Take 1 Package by mouth daily.    . nitroGLYCERIN (NITROSTAT) 0.4 MG SL tablet Place 1 tablet (0.4 mg total) under the tongue every 5 (five) minutes x 3 doses as needed for chest pain. 25 tablet 12  . clopidogrel (PLAVIX) 75 MG tablet Take 1 tablet (75 mg total) by mouth daily. 30 tablet 6  . isosorbide  mononitrate (IMDUR) 30 MG 24 hr tablet Take 0.5 tablets (15 mg total) by mouth daily. 30 tablet 5   No facility-administered medications prior to visit.      Allergies:   Patient has no known allergies.   Social History   Socioeconomic History  . Marital status: Married    Spouse name: Not on file  . Number of children: Not on file  . Years of education: Not on file  . Highest education level: Not on file  Occupational History  . Not on file  Social Needs  . Financial resource strain: Not on file  . Food insecurity:    Worry: Not on file    Inability: Not on file  . Transportation needs:    Medical: Not on file    Non-medical: Not on file  Tobacco Use  . Smoking status: Never Smoker  . Smokeless tobacco: Never Used  Substance and Sexual Activity  . Alcohol use: Yes    Alcohol/week: 2.4 oz    Types: 4 Standard drinks or equivalent per week  . Drug use: No  . Sexual activity: Yes    Partners: Male    Birth control/protection: Post-menopausal  Lifestyle  . Physical activity:    Days per week: Not on file    Minutes per session: Not on file  . Stress: Not on file  Relationships  . Social connections:    Talks on phone: Not on file    Gets together: Not on file    Attends religious service: Not on file    Active member of club or organization: Not on file    Attends meetings of clubs or organizations: Not on file    Relationship status: Not on file  Other Topics Concern  . Not on file  Social History Narrative  . Not on file     Family History:  The patient's family history includes COPD in her mother; Heart attack in her maternal grandmother; Heart disease in her father and mother; Osteoporosis in her mother; Testicular cancer in her son.   ROS:   Please see the history of present illness.    ROS All other systems reviewed and are negative.   PHYSICAL EXAM:   VS:  BP 120/80   Pulse (!) 58   Ht 5\' 7"  (1.702 m)   Wt 129 lb 12.8 oz (58.9 kg)   LMP  09/08/2014   BMI 20.33 kg/m    GEN: Well nourished, well developed, in no acute distress  HEENT: normal  Neck: no JVD, carotid bruits, or masses Cardiac: RRR; no murmurs, rubs, or gallops,no edema  Respiratory:  clear to auscultation bilaterally, normal work of breathing GI: soft, nontender, nondistended, + BS MS: no deformity or atrophy  Skin: warm and dry, no rash Neuro:  Alert and Oriented x 3, Strength and sensation are intact Psych: euthymic mood, full affect  Wt Readings from Last 3 Encounters:  10/18/17 129 lb 12.8 oz (58.9 kg)  09/26/17 123 lb (55.8 kg)  09/10/17 121 lb 6.4 oz (55.1 kg)      Studies/Labs Reviewed:   EKG:  EKG is not ordered today.   Recent Labs: 09/09/2017: ALT 21 09/26/2017: BUN 10; Creatinine, Ser 0.85; Hemoglobin 13.8; Magnesium 2.4; Platelets 334; Potassium 4.8; Sodium 144; TSH 1.170   Lipid Panel    Component Value Date/Time   CHOL 275 (H) 06/15/2017 1407   TRIG 48 06/15/2017 1407   HDL 74 06/15/2017 1407   CHOLHDL 3.7 06/15/2017 1407   CHOLHDL 4.8 06/09/2016 1200   VLDL 17 06/09/2016 1200   LDLCALC 191 (H) 06/15/2017 1407   LDLDIRECT 202.3 03/27/2013 1412    Additional studies/ records that were reviewed today include:   Cath 09/08/2017  Previously placed Mid LAD stent (unknown type) is widely patent.  RPDA lesion is 100% stenosed.  Prox LAD lesion is 20% stenosed.  The left ventricular ejection fraction is 55-65% by visual estimate.  The left ventricular systolic function is normal.  LV end diastolic pressure is normal.  There is no aortic valve stenosis.  Unable to cross the occlusion in the PDA, which appeared to be a spontaneous dissection.   Continue medical therapy.  Will stop anticoagulation.  Continue aspirin.     Echo 09/10/2017 LV EF: 60% -   65% Study Conclusions  - Left ventricle: The cavity size was normal. Wall thickness was   normal. Systolic function was normal. The estimated ejection   fraction was in  the range of 60% to 65%. Wall motion was normal;   there were no regional wall motion abnormalities. The study is   not technically sufficient to allow evaluation of LV diastolic   function. - Aortic valve: Valve area (VTI): 2.23 cm^2. Valve area (Vmax):   2.12 cm^2. Valve area (Vmean): 2.2 cm^2. - Mitral valve: Valve area by pressure half-time: 1.57 cm^2. - Atrial septum: No defect or patent foramen ovale was identified. - Technically difficult study.   ASSESSMENT:    1. Coronary artery dissection   2. Hyperlipidemia, unspecified hyperlipidemia type   3. Chronic diastolic heart failure (HCC)      PLAN:  In order of problems listed above:  1. Coronary artery dissection: Recent cardiac catheterization revealed a patent mid LAD stent, likely coronary dissection in the PDA vessel.  This was unable to be fixed, therefore medical management was recommended.  During the last office visit, she did complain of occasional chest discomfort at rest but not with exertion.  This was found to be atypical, however patient was placed on Plavix and Imdur.  She did not tolerate both medications and has not been taking them for the past 2 weeks.  During the recent trip to [place], she was able to climb mountains for 6 to 7 hours a day without any exertional chest pain or shortness of breath.  I am okay with her off of Plavix and Imdur.  2. Hyperlipidemia: Her previous lipid panel demonstrated total cholesterol 275, LDL 192.  She has been placed on high-dose Lipitor 80 mg daily.  Need fasting lipid panel and LFT in 6-8 weeks.  3. Chronic diastolic heart failure: Euvolemic on physical exam.  No sign of volume overload.  She is not on a diuretic.   Medication  Adjustments/Labs and Tests Ordered: Current medicines are reviewed at length with the patient today.  Concerns regarding medicines are outlined above.  Medication changes, Labs and Tests ordered today are listed in the Patient Instructions  below. Patient Instructions  Medication Instructions:    STOP TAKING PLAVIX AND IMDUR   If you need a refill on your cardiac medications before your next appointment, please call your pharmacy.  Labwork: RETURN FOR FASTING LIPID AD LIVER LABS  IN  6  TO  8  WEEKS   Testing/Procedures: NONE ORDERED  TODAY    Follow-Up:  IN 3 TO 4 MONTHS WITH DR Tamala Julian    Any Other Special Instructions Will Be Listed Below (If Applicable).                                                                                                                                                      Hilbert Corrigan, [place]  10/18/2017 2:21 PM    [facility] [address], [place], [place]  [address] Phone: [phone]; Fax: [phone]

## 2017-10-30 ENCOUNTER — Other Ambulatory Visit: Payer: Self-pay | Admitting: Physician Assistant

## 2018-01-16 NOTE — Progress Notes (Signed)
Cardiology Office Note:    Date:  01/17/2018   ID:  Karina Richards, DOB [birth year], MRN [id number]  PCP:  Manon Hilding, MD  Cardiologist:  Sinclair Grooms, MD   Referring MD: Manon Hilding, MD   Chief Complaint  Patient presents with  . Coronary Artery Disease    History of Present Illness:    Karina Richards is a 52 y.o. 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  Patient recently was admitted to the hospital in June 2019 for NSTEMI.  Troponin peaked at 5.16.  Cardiac catheterization performed on 09/09/2018 2019 showed 100% RPDA occlusion, 20% proximal LAD stenosis, widely patent mid LAD stent, no significant aortic valve stenosis, EF 55 to 65%.  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    No recurrence of angina.  She denies dyspnea.  No nitroglycerin use.  No medication side effects.  [place]ent to [place] and did [place] hiking without complications or problems.  Suspect she has had to scad events.  She does have significant lipid elevation.  She has been somewhat anti-medication therapy but is compliant with her current program.   Past Medical History:  Diagnosis Date  . Adnexal mass 2016   resolved by ultrasound 07/29/16.  Marland Kitchen CHF (congestive heart failure) ([place])   . Complication of anesthesia    " It makes me crazy "  . Coronary artery disease   . Herpes simplex type 1 infection   . Myocardial infarction [facility])    2014 LAD SCAD, 09/08/17 100% occluded PDA suspect SCAD no intervention    Past Surgical History:  Procedure Laterality Date  . CORONARY STENT INTERVENTION  12/31/2012  . INGUINAL HERNIA REPAIR Right 2008  . LEFT HEART CATH AND CORONARY ANGIOGRAPHY N/A 09/08/2017   Procedure: LEFT HEART CATH AND CORONARY ANGIOGRAPHY;  Surgeon: Jettie Booze, MD;  Location: [place] [facility];  Service: Cardiovascular;  Laterality: N/A;  . LEFT HEART CATHETERIZATION WITH CORONARY ANGIOGRAM N/A 12/31/2012   LAD DES    Current Medications: Current Meds  Medication Sig  . aspirin EC 81 MG EC tablet Take 1 tablet (81 mg total) by mouth daily.  Marland Kitchen atorvastatin (LIPITOR) 80 MG tablet TAKE 1 TABLET (80 MG TOTAL) BY MOUTH DAILY AT 6 PM.  . Cholecalciferol LIQD Take 4,000 Units by mouth daily. Uses one drop on the tongue each morning  . metoprolol tartrate (LOPRESSOR) 25 MG tablet Take 0.5 tablets (12.5 mg total) by mouth 2 (two) times daily.  . Multiple Vitamins-Minerals (EMERGEN-C VITAMIN C) PACK Take 1 Package by mouth daily.  . nitroGLYCERIN (NITROSTAT) 0.4 MG SL tablet Place 1 tablet (0.4 mg total) under the tongue every 5 (five) minutes x 3 doses as needed for chest pain.     Allergies:   Patient has no known allergies.   Social History   Socioeconomic History  . Marital status: Married    Spouse name: Not on file  . Number of children: Not on file  . Years of education: Not on file  . Highest education level: Not on file  Occupational History  . Not on file  Social Needs  . Financial resource strain: Not on file  . Food insecurity:    Worry: Not on file    Inability: Not on file  .  Transportation needs:    Medical: Not on file    Non-medical: Not on file  Tobacco Use  . Smoking status: Never Smoker  . Smokeless tobacco: Never Used  Substance and Sexual Activity  . Alcohol use: Yes    Alcohol/week: 4.0 standard drinks    Types: 4 Standard drinks or equivalent per week  . Drug use: No  . Sexual activity: Yes    Partners: Male    Birth control/protection: Post-menopausal  Lifestyle  . Physical activity:    Days per week: Not on file    Minutes per session: Not on file  . Stress: Not on file  Relationships  . Social connections:    Talks on phone: Not on file    Gets together: Not on file    Attends religious service: Not on file    Active  member of club or organization: Not on file    Attends meetings of clubs or organizations: Not on file    Relationship status: Not on file  Other Topics Concern  . Not on file  Social History Narrative  . Not on file     Family History: The patient's family history includes COPD in her mother; Heart attack in her maternal grandmother; Heart disease in her father and mother; Osteoporosis in her mother; Testicular cancer in her son.  ROS:   Please see the history of present illness.    Anxiety and stress.  Her son has testicular cancer.  All other systems reviewed and are negative.  EKGs/Labs/Other Studies Reviewed:    The following studies were reviewed today: Angiography 09/08/2017: Diagnostic Diagram        EKG:  EKG is not ordered today.  The ekg 09/26/2017 is personally reviewed and reveals incomplete right bundle with decreased anterior forces and precordial T wave inversion.  Recent Labs: 09/09/2017: ALT 21 09/26/2017: BUN 10; Creatinine, Ser 0.85; Hemoglobin 13.8; Magnesium 2.4; Platelets 334; Potassium 4.8; Sodium 144; TSH 1.170  Recent Lipid Panel    Component Value Date/Time   CHOL 275 (H) 06/15/2017 1407   TRIG 48 06/15/2017 1407   HDL 74 06/15/2017 1407   CHOLHDL 3.7 06/15/2017 1407   CHOLHDL 4.8 06/09/2016 1200   VLDL 17 06/09/2016 1200   LDLCALC 191 (H) 06/15/2017 1407   LDLDIRECT 202.3 03/27/2013 1412    Physical Exam:    VS:  BP 116/80   Pulse 63   Ht 5\' 7"  (1.702 m)   Wt 129 lb 9.6 oz (58.8 kg)   LMP 09/08/2014   BMI 20.30 kg/m     Wt Readings from Last 3 Encounters:  01/17/18 129 lb 9.6 oz (58.8 kg)  10/18/17 129 lb 12.8 oz (58.9 kg)  09/26/17 123 lb (55.8 kg)     GEN:  Well nourished, well developed in no acute distress HEENT: Normal NECK: No JVD. LYMPHATICS: No lymphadenopathy CARDIAC: RRR, no murmur, no gallop, no edema. VASCULAR: 2+ bilateral radial and carotid pulses.  No bruits. RESPIRATORY:  Clear to auscultation without rales,  wheezing or rhonchi  ABDOMEN: Soft, non-tender, non-distended, No pulsatile mass, MUSCULOSKELETAL: No deformity  SKIN: Warm and dry NEUROLOGIC:  Alert and oriented x 3 PSYCHIATRIC:  Normal affect   ASSESSMENT:    1. Hyperlipidemia LDL goal <70   2. Coronary artery disease involving native coronary artery of native heart without angina pectoris   3. Chronic diastolic (congestive) heart failure ([place])   4. Hyperlipidemia, unspecified hyperlipidemia type    PLAN:    In order  of problems listed above:  1. Severe lipid elevation with most recent LDL of 191 in March 2019.  She has not followed up for lipid monitoring.  She is unreliable with reference to follow-up.  We will go ahead and do a liver and lipid panel today.  She is on high intensity statin therapy.  Target less than 70. 2. 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  One was treated with stenting.  The other was not treated due to total occlusion and in ability to wire. 3. No evidence of volume overload.  Continue current therapy.  Lipid panel today.  Secondary prevention discussed.  Beta-blocker therapy is important to use given suspicion of scad.  Her blood pressure is relatively low and we are unable to institute ACE inhibitor therapy.  Greater than 50% of the time during this office visit was spent in education, counseling, and coordination of care related to underlying disease process and testing as outlined.   Medication Adjustments/Labs and Tests Ordered: Current medicines are reviewed at length with the patient today.  Concerns regarding medicines are outlined above.  No orders of the defined types were placed in this encounter.  No orders of the defined types were placed in this encounter.   There are no Patient Instructions on file for this visit.   Signed, Sinclair Grooms, MD  01/17/2018 1:46 PM    [facility]

## 2018-01-17 ENCOUNTER — Ambulatory Visit: Payer: BLUE CROSS/BLUE SHIELD | Admitting: Interventional Cardiology

## 2018-01-17 ENCOUNTER — Encounter: Payer: Self-pay | Admitting: Interventional Cardiology

## 2018-01-17 VITALS — BP 116/80 | HR 63 | Ht 67.0 in | Wt 129.6 lb

## 2018-01-17 DIAGNOSIS — E785 Hyperlipidemia, unspecified: Secondary | ICD-10-CM

## 2018-01-17 DIAGNOSIS — I2510 Atherosclerotic heart disease of native coronary artery without angina pectoris: Secondary | ICD-10-CM | POA: Diagnosis not present

## 2018-01-17 DIAGNOSIS — I5032 Chronic diastolic (congestive) heart failure: Secondary | ICD-10-CM | POA: Diagnosis not present

## 2018-01-17 NOTE — Patient Instructions (Signed)
Medication Instructions:  Your physician recommends that you continue on your current medications as directed. Please refer to the Current Medication list given to you today.  If you need a refill on your cardiac medications before your next appointment, please call your pharmacy.   Lab work: Lipid and Liver today  If you have labs (blood work) drawn today and your tests are completely normal, you will receive your results only by: Marland Kitchen MyChart Message (if you have MyChart) OR . A paper copy in the mail If you have any lab test that is abnormal or we need to change your treatment, we will call you to review the results.  Testing/Procedures: None  Follow-Up: At [facility], you and your health needs are our priority.  As part of our continuing mission to provide you with exceptional heart care, we have created designated Provider Care Teams.  These Care Teams include your primary Cardiologist (physician) and Advanced Practice Providers (APPs -  Physician Assistants and Nurse Practitioners) who all work together to provide you with the care you need, when you need it. You will need a follow up appointment in 9-12 months.  Please call our office 2 months in advance to schedule this appointment.  You may see Sinclair Grooms, MD or one of the following Advanced Practice Providers on your designated Care Team:   Truitt Merle, NP Cecilie Kicks, NP . Kathyrn Drown, NP  Any Other Special Instructions Will Be Listed Below (If Applicable).

## 2018-01-18 LAB — HEPATIC FUNCTION PANEL
ALK PHOS: 70 IU/L (ref 39–117)
ALT: 24 IU/L (ref 0–32)
AST: 27 IU/L (ref 0–40)
Albumin: 4.7 g/dL (ref 3.5–5.5)
BILIRUBIN, DIRECT: 0.36 mg/dL (ref 0.00–0.40)
Bilirubin Total: 1.8 mg/dL — ABNORMAL HIGH (ref 0.0–1.2)
Total Protein: 6.6 g/dL (ref 6.0–8.5)

## 2018-01-18 LAB — LIPID PANEL
CHOLESTEROL TOTAL: 177 mg/dL (ref 100–199)
Chol/HDL Ratio: 3.1 ratio (ref 0.0–4.4)
HDL: 58 mg/dL (ref >39)
LDL Calculated: 109 mg/dL — ABNORMAL HIGH (ref 0–99)
TRIGLYCERIDES: 49 mg/dL (ref 0–149)
VLDL CHOLESTEROL CAL: 10 mg/dL (ref 5–40)

## 2018-01-19 ENCOUNTER — Telehealth: Payer: Self-pay | Admitting: *Deleted

## 2018-01-19 DIAGNOSIS — E785 Hyperlipidemia, unspecified: Secondary | ICD-10-CM

## 2018-01-19 NOTE — Telephone Encounter (Signed)
-----   Message from Belva Crome, MD sent at 01/18/2018  8:12 AM EDT ----- Let the patient know the cholesterol levels are much improved.  They are not ideal.  Our goal is LDL cholesterol less than 70.  Current LDL is 109.  Need to repeat test in 1 year including liver panel.  Be careful with animal fats in diet. A copy will be sent to Manon Hilding, MD

## 2018-01-19 NOTE — Telephone Encounter (Signed)
Working in [employer].  Left message for pt to call back re: lab results. Orders and recall already in for 1 year.

## 2018-01-23 NOTE — Telephone Encounter (Signed)
Patient did not answer, left another voicemail for the patient to call the office to discuss.

## 2018-01-23 NOTE — Telephone Encounter (Signed)
Follow up:   Patient returning call from 10/24 about some results

## 2018-01-26 NOTE — Telephone Encounter (Signed)
Spoke to patient and gave her Dr Thompson Caul recommendations.  She verbalized understanding.

## 2018-02-03 ENCOUNTER — Other Ambulatory Visit: Payer: Self-pay | Admitting: Physician Assistant

## 2018-07-05 ENCOUNTER — Ambulatory Visit: Payer: BLUE CROSS/BLUE SHIELD | Admitting: Certified Nurse Midwife

## 2018-07-30 ENCOUNTER — Other Ambulatory Visit: Payer: Self-pay | Admitting: Physician Assistant

## 2018-08-25 ENCOUNTER — Telehealth: Payer: Self-pay | Admitting: Certified Nurse Midwife

## 2018-08-25 NOTE — Telephone Encounter (Signed)
Left message on voicemail to call and reschedule cancelled appointment. °

## 2018-12-18 ENCOUNTER — Telehealth: Payer: Self-pay

## 2018-12-18 NOTE — Telephone Encounter (Signed)
-----   Message from Loren Racer, LPN sent at [phone]  6:55 PM EDT ----- Needs Lipid and liver in late October

## 2018-12-18 NOTE — Telephone Encounter (Signed)
LMTCB to schedule labs 

## 2019-01-10 NOTE — Telephone Encounter (Signed)
Spoke with pt and scheduled labs for 01/17/2019.

## 2019-01-17 ENCOUNTER — Encounter (INDEPENDENT_AMBULATORY_CARE_PROVIDER_SITE_OTHER): Payer: Self-pay

## 2019-01-17 ENCOUNTER — Other Ambulatory Visit: Payer: Self-pay

## 2019-01-17 ENCOUNTER — Other Ambulatory Visit: Payer: BC Managed Care – PPO

## 2019-01-17 DIAGNOSIS — E785 Hyperlipidemia, unspecified: Secondary | ICD-10-CM

## 2019-01-18 LAB — HEPATIC FUNCTION PANEL
ALT: 28 IU/L (ref 0–32)
AST: 28 IU/L (ref 0–40)
Albumin: 4.6 g/dL (ref 3.8–4.9)
Alkaline Phosphatase: 59 IU/L (ref 39–117)
Bilirubin Total: 1.8 mg/dL — ABNORMAL HIGH (ref 0.0–1.2)
Bilirubin, Direct: 0.32 mg/dL (ref 0.00–0.40)
Total Protein: 6.5 g/dL (ref 6.0–8.5)

## 2019-01-18 LAB — LIPID PANEL
Chol/HDL Ratio: 2.7 ratio (ref 0.0–4.4)
Cholesterol, Total: 142 mg/dL (ref 100–199)
HDL: 53 mg/dL (ref >39)
LDL Chol Calc (NIH): 79 mg/dL (ref 0–99)
Triglycerides: 43 mg/dL (ref 0–149)
VLDL Cholesterol Cal: 10 mg/dL (ref 5–40)

## 2019-01-25 ENCOUNTER — Other Ambulatory Visit: Payer: Self-pay | Admitting: Physician Assistant

## 2019-01-29 ENCOUNTER — Other Ambulatory Visit: Payer: Self-pay | Admitting: Physician Assistant

## 2019-02-04 NOTE — Progress Notes (Signed)
Cardiology Office Note:    Date:  02/05/2019   ID:  Karina Richards, DOB November 27, 1965, MRN [id number]  PCP:  Manon Hilding, MD  Cardiologist:  Sinclair Grooms, MD   Referring MD: Manon Hilding, MD   Chief Complaint  Patient presents with  . Coronary Artery Disease    History of Present Illness:    Karina Richards is a 53 y.o. female with a hx of chronic diastolic heart failure, ischemic cardiomyopathywith improved EF, hyperlipidemia and CAD versusSCAD with stent placed in the LAD in 2014, and NSTEMI 2019 PDA SCAD.  She is not having angina.  She has figured out how to relieve stress in her life.  At work she does not let things bother her as much as he used to.  Her son is undergoing chemotherapy for testicular cancer and is doing okay.  She has not needed to use any nitroglycerin.  She feels that her last cardiac event was related to stress as both her son have the diagnosis of testicular cancer and a very close friend died all in the same day.  She is physically active.  She denies shortness of breath.  No particular complaints otherwise.  Past Medical History:  Diagnosis Date  . Adnexal mass 2016   resolved by ultrasound 07/29/16.  Marland Kitchen CHF (congestive heart failure) ([place])   . Complication of anesthesia    " It makes me crazy "  . Coronary artery disease   . Herpes simplex type 1 infection   . Myocardial infarction [facility])    2014 LAD SCAD, 09/08/17 100% occluded PDA suspect SCAD no intervention    Past Surgical History:  Procedure Laterality Date  . CORONARY STENT INTERVENTION  12/31/2012  . INGUINAL HERNIA REPAIR Right 2008  . LEFT HEART CATH AND CORONARY ANGIOGRAPHY N/A 09/08/2017   Procedure: LEFT HEART CATH AND CORONARY ANGIOGRAPHY;  Surgeon: Jettie Booze, MD;  Location: [facility];  Service: Cardiovascular;  Laterality: N/A;  . LEFT HEART CATHETERIZATION WITH CORONARY ANGIOGRAM N/A 12/31/2012   LAD DES    Current Medications: Current Meds  Medication  Sig  . aspirin EC 81 MG EC tablet Take 1 tablet (81 mg total) by mouth daily.  Marland Kitchen atorvastatin (LIPITOR) 80 MG tablet TAKE 1 TABLET (80 MG TOTAL) BY MOUTH DAILY AT 6 PM.  . Cholecalciferol LIQD Take 4,000 Units by mouth daily. Uses one drop on the tongue each morning  . metoprolol tartrate (LOPRESSOR) 25 MG tablet Take 0.5 tablets (12.5 mg total) by mouth 2 (two) times daily. Please keep upcoming appt in November before anymore refills. Thank you  . Multiple Vitamins-Minerals (EMERGEN-C VITAMIN C) PACK Take 1 Package by mouth daily.  . nitroGLYCERIN (NITROSTAT) 0.4 MG SL tablet Place 1 tablet (0.4 mg total) under the tongue every 5 (five) minutes x 3 doses as needed for chest pain.     Allergies:   Patient has no known allergies.   Social History   Socioeconomic History  . Marital status: Married    Spouse name: Not on file  . Number of children: Not on file  . Years of education: Not on file  . Highest education level: Not on file  Occupational History  . Not on file  Social Needs  . Financial resource strain: Not on file  . Food insecurity    Worry: Not on file    Inability: Not on file  . Transportation needs    Medical: Not on file  Non-medical: Not on file  Tobacco Use  . Smoking status: Never Smoker  . Smokeless tobacco: Never Used  Substance and Sexual Activity  . Alcohol use: Yes    Alcohol/week: 4.0 standard drinks    Types: 4 Standard drinks or equivalent per week  . Drug use: No  . Sexual activity: Yes    Partners: Male    Birth control/protection: Post-menopausal  Lifestyle  . Physical activity    Days per week: Not on file    Minutes per session: Not on file  . Stress: Not on file  Relationships  . Social Herbalist on phone: Not on file    Gets together: Not on file    Attends religious service: Not on file    Active member of club or organization: Not on file    Attends meetings of clubs or organizations: Not on file    Relationship  status: Not on file  Other Topics Concern  . Not on file  Social History Narrative  . Not on file     Family History: The patient's family history includes COPD in her mother; Heart attack in her maternal grandmother; Heart disease in her father and mother; Osteoporosis in her mother; Testicular cancer in her son.  ROS:   Please see the history of present illness.    No complaints all other systems reviewed and are negative.  EKGs/Labs/Other Studies Reviewed:    The following studies were reviewed today: No new data  EKG:  EKG normal sinus rhythm, incomplete right bundle branch block, nonspecific T wave flattening.  Poor R wave progression V1 through V3.  Recent Labs: 01/17/2019: ALT 28  Recent Lipid Panel    Component Value Date/Time   CHOL 142 01/17/2019 1329   TRIG 43 01/17/2019 1329   HDL 53 01/17/2019 1329   CHOLHDL 2.7 01/17/2019 1329   CHOLHDL 4.8 06/09/2016 1200   VLDL 17 06/09/2016 1200   LDLCALC 79 01/17/2019 1329   LDLDIRECT 202.3 03/27/2013 1412    Physical Exam:    VS:  BP 122/80   Pulse 63   Ht 5\' 7"  (1.702 m)   Wt 142 lb 3.2 oz (64.5 kg)   LMP 09/08/2014   SpO2 99%   BMI 22.27 kg/m     Wt Readings from Last 3 Encounters:  02/05/19 142 lb 3.2 oz (64.5 kg)  01/17/18 129 lb 9.6 oz (58.8 kg)  10/18/17 129 lb 12.8 oz (58.9 kg)     GEN: Young in appearance. No acute distress HEENT: Normal NECK: No JVD. LYMPHATICS: No lymphadenopathy CARDIAC:  RRR without murmur, gallop, or edema. VASCULAR:  Normal Pulses. No bruits. RESPIRATORY:  Clear to auscultation without rales, wheezing or rhonchi  ABDOMEN: Soft, non-tender, non-distended, No pulsatile mass, MUSCULOSKELETAL: No deformity  SKIN: Warm and dry NEUROLOGIC:  Alert and oriented x 3 PSYCHIATRIC:  Normal affect   ASSESSMENT:    1. Coronary artery disease involving native coronary artery of native heart without angina pectoris   2. Hyperlipidemia, unspecified hyperlipidemia type   3. Chronic  diastolic (congestive) heart failure (HCC)   4. Educated about COVID-19 virus infection    PLAN:    In order of problems listed above:  1. Secondary prevention discussed in detail. 2. LDL target 70 or less.  She is at 79 on high intensity atorvastatin.  No changes made. 3. There is no evidence of volume overload. 4. 3W's is discussed and his being practiced.  Overall education and awareness concerning  primary/secondary risk prevention was discussed in detail: LDL less than 70, hemoglobin A1c less than 7, blood pressure target less than 130/80 mmHg, >150 minutes of moderate aerobic activity per week, avoidance of smoking, weight control (via diet and exercise), and continued surveillance/management of/for obstructive sleep apnea.    Medication Adjustments/Labs and Tests Ordered: Current medicines are reviewed at length with the patient today.  Concerns regarding medicines are outlined above.  Orders Placed This Encounter  Procedures  . EKG 12-Lead   No orders of the defined types were placed in this encounter.   Patient Instructions  Medication Instructions:  Your physician recommends that you continue on your current medications as directed. Please refer to the Current Medication list given to you today.  *If you need a refill on your cardiac medications before your next appointment, please call your pharmacy*  Lab Work: None If you have labs (blood work) drawn today and your tests are completely normal, you will receive your results only by: Marland Kitchen MyChart Message (if you have MyChart) OR . A paper copy in the mail If you have any lab test that is abnormal or we need to change your treatment, we will call you to review the results.  Testing/Procedures: None  Follow-Up: At [facility], you and your health needs are our priority.  As part of our continuing mission to provide you with exceptional heart care, we have created designated Provider Care Teams.  These Care Teams  include your primary Cardiologist (physician) and Advanced Practice Providers (APPs -  Physician Assistants and Nurse Practitioners) who all work together to provide you with the care you need, when you need it.  Your next appointment:   12 months  The format for your next appointment:   In Person  Provider:   You may see Sinclair Grooms, MD or one of the following Advanced Practice Providers on your designated Care Team:    Truitt Merle, NP  Cecilie Kicks, NP  Kathyrn Drown, NP   Other Instructions      Signed, Sinclair Grooms, MD  02/05/2019 4:47 PM    [facility]

## 2019-02-05 ENCOUNTER — Other Ambulatory Visit: Payer: Self-pay

## 2019-02-05 ENCOUNTER — Ambulatory Visit: Payer: BC Managed Care – PPO | Admitting: Interventional Cardiology

## 2019-02-05 ENCOUNTER — Encounter: Payer: Self-pay | Admitting: Interventional Cardiology

## 2019-02-05 VITALS — BP 122/80 | HR 63 | Ht 67.0 in | Wt 142.2 lb

## 2019-02-05 DIAGNOSIS — E785 Hyperlipidemia, unspecified: Secondary | ICD-10-CM

## 2019-02-05 DIAGNOSIS — I5032 Chronic diastolic (congestive) heart failure: Secondary | ICD-10-CM | POA: Diagnosis not present

## 2019-02-05 DIAGNOSIS — Z7189 Other specified counseling: Secondary | ICD-10-CM

## 2019-02-05 DIAGNOSIS — I2510 Atherosclerotic heart disease of native coronary artery without angina pectoris: Secondary | ICD-10-CM

## 2019-02-05 NOTE — Patient Instructions (Signed)

## 2019-04-20 ENCOUNTER — Other Ambulatory Visit: Payer: Self-pay | Admitting: Physician Assistant

## 2019-04-24 ENCOUNTER — Other Ambulatory Visit: Payer: Self-pay | Admitting: Interventional Cardiology

## 2019-06-20 ENCOUNTER — Encounter: Payer: Self-pay | Admitting: Certified Nurse Midwife

## 2020-03-02 NOTE — Progress Notes (Signed)
Cardiology Office Note:    Date:  03/03/2020   ID:  Karina Richards, DOB September 13, 1965, MRN [id number]  PCP:  Manon Hilding, MD  Cardiologist:  Sinclair Grooms, MD   Referring MD: Manon Hilding, MD   Chief Complaint  Patient presents with  . Coronary Artery Disease    History of Present Illness:    Karina Richards is a 53 y.o. female with a hx of chronic diastolic heart failure, ischemic cardiomyopathywith improved EF to 65% 2019,, hyperlipidemia and CAD versusSCAD with stent placed in the LAD in 2014, and NSTEMI 2019 PDA SCAD.  Chest complaints.  Does not smoke.  She is gaining some weight.  She is mindful of this.  She has not had angina.  Both of her cardiac events have occurred in the setting of significant external emotional stress.  Can full-time at [employer].  Much physical activity required in her daily work activities.  Sleeping well.  Compliant with her medication regimen.  Past Medical History:  Diagnosis Date  . Adnexal mass 2016   resolved by ultrasound 07/29/16.  Marland Kitchen CHF (congestive heart failure) ([place])   . Complication of anesthesia    " It makes me crazy "  . Coronary artery disease   . Herpes simplex type 1 infection   . Myocardial infarction [facility])    2014 LAD SCAD, 09/08/17 100% occluded PDA suspect SCAD no intervention    Past Surgical History:  Procedure Laterality Date  . CORONARY STENT INTERVENTION  12/31/2012  . INGUINAL HERNIA REPAIR Right 2008  . LEFT HEART CATH AND CORONARY ANGIOGRAPHY N/A 09/08/2017   Procedure: LEFT HEART CATH AND CORONARY ANGIOGRAPHY;  Surgeon: Jettie Booze, MD;  Location: [place] [facility];  Service: Cardiovascular;  Laterality: N/A;  . LEFT HEART CATHETERIZATION WITH CORONARY ANGIOGRAM N/A 12/31/2012   LAD DES    Current Medications: Current Meds  Medication Sig  . aspirin EC 81 MG EC tablet Take 1 tablet (81 mg total) by mouth daily.  Marland Kitchen atorvastatin (LIPITOR) 80 MG tablet TAKE 1 TABLET (80 MG TOTAL) BY MOUTH DAILY AT 6  PM.  . Cholecalciferol LIQD Take 4,000 Units by mouth daily. Uses one drop on the tongue each morning  . metoprolol tartrate (LOPRESSOR) 25 MG tablet TAKE 0.5 TABLETS (12.5 MG TOTAL) BY MOUTH 2 (TWO) TIMES DAILY.  . Multiple Vitamins-Minerals (EMERGEN-C VITAMIN C) PACK Take 1 Package by mouth daily.  . nitroGLYCERIN (NITROSTAT) 0.4 MG SL tablet Place 1 tablet (0.4 mg total) under the tongue every 5 (five) minutes x 3 doses as needed for chest pain.     Allergies:   Patient has no known allergies.   Social History   Socioeconomic History  . Marital status: Married    Spouse name: Not on file  . Number of children: Not on file  . Years of education: Not on file  . Highest education level: Not on file  Occupational History  . Not on file  Tobacco Use  . Smoking status: Never Smoker  . Smokeless tobacco: Never Used  Vaping Use  . Vaping Use: Never used  Substance and Sexual Activity  . Alcohol use: Yes    Alcohol/week: 4.0 standard drinks    Types: 4 Standard drinks or equivalent per week  . Drug use: No  . Sexual activity: Yes    Partners: Male    Birth control/protection: Post-menopausal  Other Topics Concern  . Not on file  Social History Narrative  . Not on  file   Social Determinants of Health   Financial Resource Strain:   . Difficulty of Paying Living Expenses: Not on file  Food Insecurity:   . Worried About Charity fundraiser in the Last Year: Not on file  . Ran Out of Food in the Last Year: Not on file  Transportation Needs:   . Lack of Transportation (Medical): Not on file  . Lack of Transportation (Non-Medical): Not on file  Physical Activity:   . Days of Exercise per Week: Not on file  . Minutes of Exercise per Session: Not on file  Stress:   . Feeling of Stress : Not on file  Social Connections:   . Frequency of Communication with Friends and Family: Not on file  . Frequency of Social Gatherings with Friends and Family: Not on file  . Attends Religious  Services: Not on file  . Active Member of Clubs or Organizations: Not on file  . Attends Archivist Meetings: Not on file  . Marital Status: Not on file     Family History: The patient's family history includes COPD in her mother; Heart attack in her maternal grandmother; Heart disease in her father and mother; Osteoporosis in her mother; Testicular cancer in her son.  ROS:   Please see the history of present illness.    No new data.  Psychologically doing quite well at this time.  Her son who is being treated for cancer last year it has improved and is no longer on therapy because of stability.  All other systems reviewed and are negative.  EKGs/Labs/Other Studies Reviewed:    The following studies were reviewed today: 2D Doppler echocardiogram 2019: Study Conclusions   - Left ventricle: The cavity size was normal. Wall thickness was  normal. Systolic function was normal. The estimated ejection  fraction was in the range of 60% to 65%. Wall motion was normal;  there were no regional wall motion abnormalities. The study is  not technically sufficient to allow evaluation of LV diastolic  function.  - Aortic valve: Valve area (VTI): 2.23 cm^2. Valve area (Vmax):  2.12 cm^2. Valve area (Vmean): 2.2 cm^2.  - Mitral valve: Valve area by pressure half-time: 1.57 cm^2.  - Atrial septum: No defect or patent foramen ovale was identified.  - Technically difficult study.   EKG:  EKG normal sinus rhythm with nonspecific T wave flattening.  RSR prime V1 and V2.  No acute changes and no significant variance from the EKG performed in November 2020.  Recent Labs: No results found for requested labs within last 8760 hours.  Recent Lipid Panel    Component Value Date/Time   CHOL 142 01/17/2019 1329   TRIG 43 01/17/2019 1329   HDL 53 01/17/2019 1329   CHOLHDL 2.7 01/17/2019 1329   CHOLHDL 4.8 06/09/2016 1200   VLDL 17 06/09/2016 1200   LDLCALC 79 01/17/2019 1329    LDLDIRECT 202.3 03/27/2013 1412    Physical Exam:    VS:  BP 126/74   Pulse 64   Ht 5\' 7"  (1.702 m)   Wt 141 lb (64 kg)   LMP 09/08/2014   SpO2 99%   BMI 22.08 kg/m     Wt Readings from Last 3 Encounters:  03/03/20 141 lb (64 kg)  02/05/19 142 lb 3.2 oz (64.5 kg)  01/17/18 129 lb 9.6 oz (58.8 kg)     GEN: Healthy-appearing. No acute distress HEENT: Normal NECK: No JVD. LYMPHATICS: No lymphadenopathy CARDIAC:  RRR without  murmur, gallop, or edema. VASCULAR:  Normal Pulses. No bruits. RESPIRATORY:  Clear to auscultation without rales, wheezing or rhonchi  ABDOMEN: Soft, non-tender, non-distended, No pulsatile mass, MUSCULOSKELETAL: No deformity  SKIN: Warm and dry NEUROLOGIC:  Alert and oriented x 3 PSYCHIATRIC:  Normal affect   ASSESSMENT:    1. Coronary artery disease involving native coronary artery of native heart without angina pectoris   2. Hyperlipidemia, unspecified hyperlipidemia type   3. Chronic diastolic (congestive) heart failure (HCC)   4. Educated about COVID-19 virus infection    PLAN:    In order of problems listed above:  1. Reviewed secondary prevention.  Decrease caloric intake discussed. 2. Target LDL less than 70.  On high intensity statin therapy.  Labs will be rechecked today.  Hemoglobin A1c will be done as well. 3. No evidence of volume overload. 4. Educated about COVID-19, vaccination, and boosting.  Overall education and awareness concerning secondary risk prevention was discussed in detail: LDL less than 70, hemoglobin A1c less than 7, blood pressure target less than 130/80 mmHg, >150 minutes of moderate aerobic activity per week, avoidance of smoking, weight control (via diet and exercise), and continued surveillance/management of/for obstructive sleep apnea.    Medication Adjustments/Labs and Tests Ordered: Current medicines are reviewed at length with the patient today.  Concerns regarding medicines are outlined above.  Orders  Placed This Encounter  Procedures  . HgB A1c  . EKG 12-Lead   No orders of the defined types were placed in this encounter.   Patient Instructions  Medication Instructions:  Your physician recommends that you continue on your current medications as directed. Please refer to the Current Medication list given to you today.  *If you need a refill on your cardiac medications before your next appointment, please call your pharmacy*   Lab Work: None If you have labs (blood work) drawn today and your tests are completely normal, you will receive your results only by: Marland Kitchen MyChart Message (if you have MyChart) OR . A paper copy in the mail If you have any lab test that is abnormal or we need to change your treatment, we will call you to review the results.   Testing/Procedures: None   Follow-Up: At [facility], you and your health needs are our priority.  As part of our continuing mission to provide you with exceptional heart care, we have created designated Provider Care Teams.  These Care Teams include your primary Cardiologist (physician) and Advanced Practice Providers (APPs -  Physician Assistants and Nurse Practitioners) who all work together to provide you with the care you need, when you need it.  We recommend signing up for the patient portal called "MyChart".  Sign up information is provided on this After Visit Summary.  MyChart is used to connect with patients for Virtual Visits (Telemedicine).  Patients are able to view lab/test results, encounter notes, upcoming appointments, etc.  Non-urgent messages can be sent to your provider as well.   To learn more about what you can do with MyChart, go to [email or web address].    Your next appointment:   12 month(s)  The format for your next appointment:   In Person  Provider:   You may see Sinclair Grooms, MD or one of the following Advanced Practice Providers on your designated Care Team:    Truitt Merle, NP  Cecilie Kicks, NP  Kathyrn Drown, NP    Other Instructions      Signed, Sinclair Grooms, MD  03/03/2020 8:52 AM    [facility]

## 2020-03-03 ENCOUNTER — Ambulatory Visit: Payer: BC Managed Care – PPO | Admitting: Interventional Cardiology

## 2020-03-03 ENCOUNTER — Other Ambulatory Visit: Payer: BC Managed Care – PPO | Admitting: *Deleted

## 2020-03-03 ENCOUNTER — Other Ambulatory Visit: Payer: Self-pay

## 2020-03-03 ENCOUNTER — Encounter: Payer: Self-pay | Admitting: Interventional Cardiology

## 2020-03-03 VITALS — BP 126/74 | HR 64 | Ht 67.0 in | Wt 141.0 lb

## 2020-03-03 DIAGNOSIS — Z7189 Other specified counseling: Secondary | ICD-10-CM

## 2020-03-03 DIAGNOSIS — E785 Hyperlipidemia, unspecified: Secondary | ICD-10-CM

## 2020-03-03 DIAGNOSIS — I2510 Atherosclerotic heart disease of native coronary artery without angina pectoris: Secondary | ICD-10-CM

## 2020-03-03 DIAGNOSIS — I5032 Chronic diastolic (congestive) heart failure: Secondary | ICD-10-CM | POA: Diagnosis not present

## 2020-03-03 LAB — LIPID PANEL

## 2020-03-03 LAB — HEPATIC FUNCTION PANEL

## 2020-03-03 LAB — BASIC METABOLIC PANEL

## 2020-03-03 LAB — CBC: RBC: 4.94 x10E6/uL (ref 3.77–5.28)

## 2020-03-03 NOTE — Patient Instructions (Signed)

## 2020-03-04 LAB — CBC
Hematocrit: 43.8 % (ref 34.0–46.6)
Hemoglobin: 14.4 g/dL (ref 11.1–15.9)
MCH: 29.1 pg (ref 26.6–33.0)
MCHC: 32.9 g/dL (ref 31.5–35.7)
MCV: 89 fL (ref 79–97)
Platelets: 234 10*3/uL (ref 150–450)
RDW: 12.2 % (ref 11.7–15.4)
WBC: 5.7 10*3/uL (ref 3.4–10.8)

## 2020-03-04 LAB — HEPATIC FUNCTION PANEL
ALT: 21 IU/L (ref 0–32)
AST: 24 IU/L (ref 0–40)
Albumin: 4.7 g/dL (ref 3.8–4.9)
Alkaline Phosphatase: 58 IU/L (ref 44–121)
Bilirubin Total: 1.2 mg/dL (ref 0.0–1.2)
Bilirubin, Direct: 0.25 mg/dL (ref 0.00–0.40)

## 2020-03-04 LAB — BASIC METABOLIC PANEL
BUN/Creatinine Ratio: 12 (ref 9–23)
BUN: 11 mg/dL (ref 6–24)
CO2: 25 mmol/L (ref 20–29)
Calcium: 9.4 mg/dL (ref 8.7–10.2)
Chloride: 102 mmol/L (ref 96–106)
GFR calc Af Amer: 77 mL/min/{1.73_m2} (ref >59)
GFR calc non Af Amer: 67 mL/min/{1.73_m2} (ref >59)
Potassium: 4.8 mmol/L (ref 3.5–5.2)
Sodium: 140 mmol/L (ref 134–144)

## 2020-03-04 LAB — LIPID PANEL
Cholesterol, Total: 218 mg/dL — ABNORMAL HIGH (ref 100–199)
HDL: 62 mg/dL (ref >39)

## 2020-03-05 ENCOUNTER — Telehealth: Payer: Self-pay | Admitting: Interventional Cardiology

## 2020-03-05 NOTE — Telephone Encounter (Signed)
Follow Up:      Returning Karina Richards's call, concerning her lab results.

## 2020-03-05 NOTE — Telephone Encounter (Signed)
From result note. "if she was fasting for labs and if she's been taking her Atorvastatin."  Patient had green tea before her lab that morning. In the last month feels like she has had more missed doses of her atorvastatin than normal but she is going to do better.   Will forward to [facility] for further advisement.

## 2020-03-06 NOTE — Telephone Encounter (Signed)
Information below added to result note.  Awaiting Dr. Thompson Caul review.

## 2020-04-19 ENCOUNTER — Other Ambulatory Visit: Payer: Self-pay | Admitting: Physician Assistant

## 2020-10-23 ENCOUNTER — Other Ambulatory Visit: Payer: Self-pay | Admitting: Interventional Cardiology

## 2021-04-17 ENCOUNTER — Other Ambulatory Visit: Payer: Self-pay | Admitting: Interventional Cardiology

## 2021-05-13 ENCOUNTER — Other Ambulatory Visit: Payer: Self-pay | Admitting: Interventional Cardiology

## 2021-10-17 ENCOUNTER — Other Ambulatory Visit: Payer: Self-pay | Admitting: Interventional Cardiology

## 2021-11-21 ENCOUNTER — Other Ambulatory Visit: Payer: Self-pay | Admitting: Interventional Cardiology

## 2021-12-18 ENCOUNTER — Other Ambulatory Visit: Payer: Self-pay | Admitting: Interventional Cardiology

## 2021-12-19 ENCOUNTER — Other Ambulatory Visit: Payer: Self-pay | Admitting: Interventional Cardiology

## 2023-07-03 ENCOUNTER — Emergency Department (HOSPITAL_COMMUNITY)
Admission: EM | Admit: 2023-07-03 | Discharge: 2023-07-03 | Disposition: A | Discharge status: Home / Self Care | Attending: Emergency Medicine | Admitting: Emergency Medicine

## 2023-07-03 ENCOUNTER — Encounter (HOSPITAL_COMMUNITY): Payer: Self-pay

## 2023-07-03 ENCOUNTER — Other Ambulatory Visit: Payer: Self-pay

## 2023-07-03 ENCOUNTER — Emergency Department (HOSPITAL_COMMUNITY)

## 2023-07-03 DIAGNOSIS — R0789 Other chest pain: Secondary | ICD-10-CM | POA: Diagnosis present

## 2023-07-03 DIAGNOSIS — Z79899 Other long term (current) drug therapy: Secondary | ICD-10-CM | POA: Insufficient documentation

## 2023-07-03 DIAGNOSIS — I2510 Atherosclerotic heart disease of native coronary artery without angina pectoris: Secondary | ICD-10-CM | POA: Insufficient documentation

## 2023-07-03 DIAGNOSIS — I10 Essential (primary) hypertension: Secondary | ICD-10-CM | POA: Insufficient documentation

## 2023-07-03 DIAGNOSIS — R079 Chest pain, unspecified: Secondary | ICD-10-CM

## 2023-07-03 DIAGNOSIS — R5383 Other fatigue: Secondary | ICD-10-CM | POA: Insufficient documentation

## 2023-07-03 DIAGNOSIS — Z7982 Long term (current) use of aspirin: Secondary | ICD-10-CM | POA: Insufficient documentation

## 2023-07-03 LAB — COMPREHENSIVE METABOLIC PANEL WITH GFR
ALT: 22 U/L (ref 0–44)
AST: 21 U/L (ref 15–41)
Albumin: 4.5 g/dL (ref 3.5–5.0)
Alkaline Phosphatase: 61 U/L (ref 38–126)
Anion gap: 12 (ref 5–15)
BUN: 13 mg/dL (ref 6–20)
CO2: 26 mmol/L (ref 22–32)
Calcium: 9.7 mg/dL (ref 8.9–10.3)
Chloride: 97 mmol/L — ABNORMAL LOW (ref 98–111)
Creatinine, Ser: 0.74 mg/dL (ref 0.44–1.00)
GFR, Estimated: >60 mL/min (ref >60)
Glucose, Bld: 86 mg/dL (ref 70–99)
Potassium: 4.3 mmol/L (ref 3.5–5.1)
Sodium: 135 mmol/L (ref 135–145)
Total Bilirubin: 1.1 mg/dL (ref 0.0–1.2)
Total Protein: 6.8 g/dL (ref 6.5–8.1)

## 2023-07-03 LAB — CBC
HCT: 44 % (ref 36.0–46.0)
Hemoglobin: 14.8 g/dL (ref 12.0–15.0)
MCH: 29.7 pg (ref 26.0–34.0)
MCHC: 33.6 g/dL (ref 30.0–36.0)
MCV: 88.4 fL (ref 80.0–100.0)
Platelets: 269 10*3/uL (ref 150–400)
RBC: 4.98 MIL/uL (ref 3.87–5.11)
RDW: 13.4 % (ref 11.5–15.5)
WBC: 7.4 10*3/uL (ref 4.0–10.5)
nRBC: 0 % (ref 0.0–0.2)

## 2023-07-03 LAB — D-DIMER, QUANTITATIVE: D-Dimer, Quant: 0.28 ug{FEU}/mL (ref 0.00–0.50)

## 2023-07-03 LAB — TROPONIN I (HIGH SENSITIVITY)
Troponin I (High Sensitivity): 9 ng/L (ref <18)
Troponin I (High Sensitivity): 10 ng/L (ref <18)

## 2023-07-03 MED ORDER — METOPROLOL TARTRATE 25 MG PO TABS: 12.5000 mg | ORAL_TABLET | Freq: Two times a day (BID) | ORAL | 0 refills | Status: AC

## 2023-07-03 NOTE — ED Triage Notes (Signed)
 Pt stated that she began having chest tightness/discomfort 3 days ago> pt has hx of MI, but recently stopped taking her aspirin due to trying herbal remedies. Pt did not take nitroglycerin due to being expired.

## 2023-07-03 NOTE — Discharge Instructions (Signed)
 Your workup today is reassuring.  Cause of your nonspecific chest pain is unclear.  Your blood pressure today is elevated.  I recommend that you start back on your metoprolol for your blood pressure.  Take as directed.  I also recommend that you record your blood pressure readings at home once daily and take this with you when you follow-up with your primary care provider or your cardiologist.  I have put in a referral to your cardiology group to have a follow-up appointment.  Someone from their office should reach out to you for appointment scheduling.  Please return to the ER for any new or worsening symptoms.

## 2023-07-03 NOTE — ED Provider Notes (Signed)
 [place] EMERGENCY DEPARTMENT AT [facility] Provider Note   CSN: [id number] Arrival date & time: 07/03/23  1504     History  Chief Complaint  Patient presents with   Chest Pain    Karina Richards is a 58 y.o. female.   Chest Pain Associated symptoms: fatigue   Associated symptoms: no abdominal pain, no back pain, no dizziness, no fever, no headache, no nausea, no numbness, no shortness of breath, no vomiting and no weakness        Karina Richards is a 58 y.o. female with significant past medical history involving coronary atherosclerosis and previous MI x 2, CHF, who presents to the Emergency Department complaining of upper chest discomfort for 2 weeks.  Symptoms not improving.  She describes feeling achy discomfort along her bilateral upper chest.  Severity of pain waxes and wanes but does not resolve.  No alleviating or aggravating factors.  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  She also describes feeling some fatigue and lack of energy.  Denies neck arm jaw pain, fever, chills, cough, back pain and shortness of breath.  Does not recall if current chest pain feels similar to previous MI symptoms.  States she has not had follow-up with her cardiologist in several years.  Has not tried any medication for symptom relief.    Home Medications Prior to Admission medications   Medication Sig Start Date End Date Taking? Authorizing Provider  aspirin  EC 81 MG EC tablet Take 1 tablet (81 mg total) by mouth daily. 01/04/13   Arty Binning, MD  atorvastatin  (LIPITOR ) 80 MG tablet TAKE 1 TABLET BY MOUTH DAILY AT 6 PM. 11/23/21   Arty Binning, MD  Cholecalciferol LIQD Take 4,000 Units by mouth daily. Uses one drop on the tongue each morning    [provider]  metoprolol  tartrate (LOPRESSOR ) 25 MG tablet TAKE 0.5 TABLETS BY MOUTH 2 TIMES DAILY. 04/17/21   Arty Binning, MD  Multiple Vitamins-Minerals (EMERGEN-C VITAMIN C) PACK Take 1 Package by mouth daily.     [provider]  nitroGLYCERIN  (NITROSTAT ) 0.4 MG SL tablet Place 1 tablet (0.4 mg total) under the tongue every 5 (five) minutes x 3 doses as needed for chest pain. 09/11/17   Narvis Bad, PA      Allergies    Patient has no known allergies.    Review of Systems   Review of Systems  Constitutional:  Positive for fatigue. Negative for appetite change, chills and fever.  Respiratory:  Negative for shortness of breath.   Cardiovascular:  Positive for chest pain. Negative for leg swelling.  Gastrointestinal:  Negative for abdominal pain, nausea and vomiting.  Genitourinary:  Negative for flank pain.  Musculoskeletal:  Negative for back pain, neck pain and neck stiffness.  Neurological:  Negative for dizziness, syncope, weakness, light-headedness, numbness and headaches.  Psychiatric/Behavioral:  Negative for confusion.     Physical Exam Updated Vital Signs BP (!) 173/111 (BP Location: Right Arm)   Pulse 77   Temp 98.3 F (36.8 C) (Oral)   Resp 18   Ht 5\' 7"  (1.702 m)   Wt 58.1 kg   LMP 09/08/2014   SpO2 100%   BMI 20.05 kg/m  Physical Exam Vitals and nursing note reviewed.  Constitutional:      General: She is not in acute distress.    Appearance: Normal appearance. She is not ill-appearing or toxic-appearing.  Neck:     Vascular: No carotid bruit.  Cardiovascular:     Rate and Rhythm: Normal rate and regular rhythm.     Pulses: Normal pulses.  Pulmonary:     Effort: Pulmonary effort is normal.  Chest:     Chest wall: Tenderness present.  Abdominal:     Palpations: Abdomen is soft.     Tenderness: There is no abdominal tenderness.  Musculoskeletal:        General: Normal range of motion.     Right lower leg: No edema.     Left lower leg: No edema.  Lymphadenopathy:     Cervical: No cervical adenopathy.  Skin:    General: Skin is warm.     Capillary Refill: Capillary refill takes less than 2 seconds.  Neurological:     General: No focal deficit  present.     Mental Status: She is alert.     Sensory: No sensory deficit.     Motor: No weakness.     ED Results / Procedures / Treatments   Labs (all labs ordered are listed, but only abnormal results are displayed) Labs Reviewed  COMPREHENSIVE METABOLIC PANEL WITH GFR - Abnormal; Notable for the following components:      Result Value   Chloride 97 (*)    All other components within normal limits  CBC  D-DIMER, QUANTITATIVE  TROPONIN I (HIGH SENSITIVITY)  TROPONIN I (HIGH SENSITIVITY)    EKG EKG Interpretation Date/Time:  Sunday July 03 2023 15:11:45 EDT Ventricular Rate:  87 PR Interval:  144 QRS Duration:  88 QT Interval:  390 QTC Calculation: 469 R Axis:   143  Text Interpretation: Normal sinus rhythm Non-specific ST-t changes Confirmed by Guadalupe Lee ([id number]) on 07/03/2023 3:25:21 PM  Radiology DG Chest Port 1 View Result Date: 07/03/2023 CLINICAL DATA:  Pain. EXAM: PORTABLE CHEST 1 VIEW COMPARISON:  09/07/2017 FINDINGS: The cardiomediastinal contours are normal. Mild biapical pleuroparenchymal scarring, right greater than left, unchanged. Pulmonary vasculature is normal. No consolidation, pleural effusion, or pneumothorax. No acute osseous abnormalities are seen. IMPRESSION: No active disease. Electronically Signed   By: Chadwick Colonel M.D.   On: 07/03/2023 16:05    Procedures Procedures     HEART score is low risk   Medications Ordered in ED Medications - No data to display  ED Course/ Medical Decision Making/ A&P                                 Medical Decision Making Patient here with remote history of MI x 2 along with CHF, not currently followed by cardiology.  Here with 2-week history of chest pain describes pain as dull across the top of her chest.  Severity of pain waxes and wanes.  Pain is nonradiating and no associated shortness of breath nausea vomiting or diaphoresis.  Only thing that she can recall that reproduces her pain is palpation.  She  cannot recall if her current symptoms are similar to her previous MIs.  No known injury or recent illness  Although history does not sound consistent with cardiac pain, she does have concerning history.  Currently she is resting comfortably in no acute distress.  She is hypertensive on arrival.  Does not appear anxious.  Differential includes ACS, PE, musculoskeletal pain, costochondritis, pleurisy, pneumonia also considered but felt less likely given lack of fever chills cough  Amount and/or Complexity of Data Reviewed Labs: ordered.    Details: Labs unremarkable, initial troponin 9 delta trop  flat d dimer reassuring Radiology: ordered.    Details: Chest x-ray without evidence of acute cardiopulmonary process ECG/medicine tests: ordered.    Details: EKG shows sinus rhythm with nonspecific ST and T wave changes Discussion of management or test interpretation with external provider(s): On further history, patient stopped taking her antihypertensive medication approximately 2 years ago.  She was taking 12.5 mg metoprolol .  Her blood pressure today has been elevated.  May be beneficial for her to restart her medication.  Will provide prescription. Doubt emergent process,  She is agreeable to close outpatient follow-up with PCP and ambulatory referral to cardiology will be placed.  Patient given return precautions.  Risk Prescription drug management.           Final Clinical Impression(s) / ED Diagnoses Final diagnoses:  Nonspecific chest pain  Hypertension, unspecified type    Rx / DC Orders ED Discharge Orders     None         Catherne Clubs, PA-C 07/04/23 2322    Guadalupe Lee, MD 07/20/23 1115
